# Patient Record
Sex: Female | Born: 1970 | Race: Black or African American | Hispanic: No | Marital: Single | Smoking: Former smoker
Health system: Southern US, Community
[De-identification: ages and names within clinical notes are randomized; demographics above are authoritative.]

## PROBLEM LIST (undated history)

## (undated) DIAGNOSIS — Z789 Other specified health status: Secondary | ICD-10-CM

## (undated) DIAGNOSIS — I10 Essential (primary) hypertension: Secondary | ICD-10-CM

## (undated) HISTORY — DX: Essential (primary) hypertension: I10

## (undated) HISTORY — PX: OTHER SURGICAL HISTORY: SHX169

---

## 2012-06-20 ENCOUNTER — Emergency Department (HOSPITAL_COMMUNITY)
Admission: EM | Admit: 2012-06-20 | Discharge: 2012-06-20 | Disposition: A | Discharge status: Home / Self Care | Payer: Self-pay | Attending: Emergency Medicine | Admitting: Emergency Medicine

## 2012-06-20 ENCOUNTER — Emergency Department (HOSPITAL_COMMUNITY): Payer: Self-pay

## 2012-06-20 ENCOUNTER — Encounter (HOSPITAL_COMMUNITY): Payer: Self-pay | Admitting: Cardiology

## 2012-06-20 DIAGNOSIS — M25469 Effusion, unspecified knee: Secondary | ICD-10-CM | POA: Insufficient documentation

## 2012-06-20 DIAGNOSIS — F17200 Nicotine dependence, unspecified, uncomplicated: Secondary | ICD-10-CM | POA: Insufficient documentation

## 2012-06-20 DIAGNOSIS — M25462 Effusion, left knee: Secondary | ICD-10-CM

## 2012-06-20 MED ORDER — NAPROXEN 250 MG PO TABS: 500.0000 mg | ORAL_TABLET | Freq: Once | ORAL | Status: DC

## 2012-06-20 MED ORDER — NAPROXEN 500 MG PO TABS: 500.0000 mg | ORAL_TABLET | Freq: Two times a day (BID) | ORAL | Status: DC

## 2012-06-20 MED ORDER — HYDROCODONE-ACETAMINOPHEN 5-325 MG PO TABS: 2.0000 | ORAL_TABLET | ORAL | Status: DC | PRN

## 2012-06-20 NOTE — ED Provider Notes (Signed)
History     CSN: [id number]  Arrival date & time 06/20/12  1054   First MD Initiated Contact with Patient 06/20/12 1245      Chief Complaint  Patient presents with  . Knee Pain    (Consider location/radiation/quality/duration/timing/severity/associated sxs/prior treatment) HPI  42 year old female with history of gout in the family presents complaining of left knee pain and swelling. Patient reports for the past couple of days she has increasing pain to left knee with some swelling. Describe pain as a sharp sensation worsening with movement and with weightbearing. Swelling has been ongoing for the past 3-4 days. Patient wear a brace last night and the swelling has improved.  Patient recall injuring the left knee many years ago and has had intermittent pain and swelling to the knee but never this significant. Otherwise she denies fever, chills, rash, numbness, weakness. No recent injury and no strenuous exercise.   History reviewed. No pertinent past medical history.  History reviewed. No pertinent past surgical history.  History reviewed. No pertinent family history.  History  Substance Use Topics  . Smoking status: Current Every Day Smoker  . Smokeless tobacco: Not on file  . Alcohol Use: Yes    OB History   Grav Para Term Preterm Abortions TAB SAB Ect Mult Living                  Review of Systems  Constitutional: Negative for fever.  Musculoskeletal: Positive for joint swelling and arthralgias. Negative for back pain.  Skin: Negative for rash.    Allergies  Review of patient's allergies indicates no known allergies.  Home Medications   Current Outpatient Rx  Name  Route  Sig  Dispense  Refill  . ibuprofen (ADVIL,MOTRIN) 200 MG tablet   Oral   Take 200 mg by mouth every 6 (six) hours as needed for pain, fever or headache.         . indomethacin (INDOCIN) 25 MG capsule   Oral   Take 25 mg by mouth once.           BP 168/90  Pulse 75  Temp(Src) 97.6  F (36.4 C) (Oral)  Resp 16  SpO2 99%  LMP 06/13/2012  Physical Exam  Nursing note and vitals reviewed. Constitutional: She appears well-developed and well-nourished. No distress.  Moderately obese  Eyes: Conjunctivae are normal.  Neck: Normal range of motion. Neck supple.  Musculoskeletal: She exhibits tenderness (L knee: moderate effusion noted with tenderness to suprapatella region.  decreased knee flexion.  NO joint laxity, no rash, no ecchymosis.  normal L hip and ankle on exam).  Neurological: She is alert.  Skin: Skin is warm. No rash noted.  Psychiatric: She has a normal mood and affect.    ED Course  Procedures (including critical care time)  1:28 PM Pt with effusion and pain to L knee.  No warmth, no redness, no rash no fever concerning for septic arthritis.  Pt is afebrile.  Able to ambulate.  Xray shows knee effusion without acute bony abnormality.  I recommend RICE therapy and referral to Ortho.  I also offer for knee aspiration but pt opted for conservative treatment first.  Return precaution discussed.    Labs Reviewed - No data to display Dg Knee Complete 4 Views Left  06/20/2012  *RADIOLOGY REPORT*  Clinical Data: Left knee pain.  LEFT KNEE - COMPLETE 4+ VIEW  Comparison: None  Findings: There is no evidence of acute fracture, subluxation or dislocation. A large  knee effusion is present. No significant degenerative changes are present. The joint space is otherwise unremarkable. No focal bony lesions are noted.  IMPRESSION: Knee effusion without acute bony abnormality.   Original Report Authenticated By: Harmon Pier, M.D.      1. Knee effusion, left       MDM  BP 168/90  Pulse 75  Temp(Src) 97.6 F (36.4 C) (Oral)  Resp 16  SpO2 99%  LMP 06/13/2012  I have reviewed nursing notes and vital signs. I personally reviewed the imaging tests through PACS system  I reviewed available ER/hospitalization records thought the EMR         Fayrene Helper,  [place] 06/20/12 1332

## 2012-06-20 NOTE — ED Provider Notes (Signed)
Medical screening examination/treatment/procedure(s) were performed by non-physician practitioner and as supervising physician I was immediately available for consultation/collaboration. Devoria Albe, MD, Armando Gang    Ward Givens, MD 06/20/12 [phone]

## 2012-06-20 NOTE — ED Notes (Signed)
Pt reports left knee pain and swelling for the past couple of days. Reports increased pain with weight bearing. Reports hx of gout in family.

## 2016-10-26 ENCOUNTER — Emergency Department (HOSPITAL_COMMUNITY): Payer: Self-pay

## 2016-10-26 DIAGNOSIS — I639 Cerebral infarction, unspecified: Principal | ICD-10-CM | POA: Diagnosis present

## 2016-10-26 DIAGNOSIS — R269 Unspecified abnormalities of gait and mobility: Secondary | ICD-10-CM | POA: Diagnosis present

## 2016-10-26 DIAGNOSIS — E785 Hyperlipidemia, unspecified: Secondary | ICD-10-CM | POA: Diagnosis present

## 2016-10-26 DIAGNOSIS — R29702 NIHSS score 2: Secondary | ICD-10-CM | POA: Diagnosis present

## 2016-10-26 DIAGNOSIS — F17210 Nicotine dependence, cigarettes, uncomplicated: Secondary | ICD-10-CM | POA: Diagnosis present

## 2016-10-26 DIAGNOSIS — Z6837 Body mass index (BMI) 37.0-37.9, adult: Secondary | ICD-10-CM

## 2016-10-26 DIAGNOSIS — Z8249 Family history of ischemic heart disease and other diseases of the circulatory system: Secondary | ICD-10-CM

## 2016-10-26 DIAGNOSIS — I10 Essential (primary) hypertension: Secondary | ICD-10-CM | POA: Diagnosis present

## 2016-10-26 DIAGNOSIS — G8191 Hemiplegia, unspecified affecting right dominant side: Secondary | ICD-10-CM | POA: Diagnosis present

## 2016-10-26 DIAGNOSIS — E669 Obesity, unspecified: Secondary | ICD-10-CM | POA: Diagnosis present

## 2016-10-26 LAB — COMPREHENSIVE METABOLIC PANEL
ALK PHOS: 63 U/L (ref 38–126)
ALT: 19 U/L (ref 14–54)
ANION GAP: 9 (ref 5–15)
AST: 19 U/L (ref 15–41)
Albumin: 4.2 g/dL (ref 3.5–5.0)
BILIRUBIN TOTAL: 0.4 mg/dL (ref 0.3–1.2)
BUN: 8 mg/dL (ref 6–20)
CALCIUM: 9.1 mg/dL (ref 8.9–10.3)
CO2: 22 mmol/L (ref 22–32)
Chloride: 103 mmol/L (ref 101–111)
Creatinine, Ser: 0.97 mg/dL (ref 0.44–1.00)
GFR calc Af Amer: >60 mL/min (ref >60)
Glucose, Bld: 89 mg/dL (ref 65–99)
POTASSIUM: 3.5 mmol/L (ref 3.5–5.1)
Sodium: 134 mmol/L — ABNORMAL LOW (ref 135–145)
TOTAL PROTEIN: 7.3 g/dL (ref 6.5–8.1)

## 2016-10-26 LAB — I-STAT CHEM 8, ED
BUN: 10 mg/dL (ref 6–20)
CALCIUM ION: 1.08 mmol/L — AB (ref 1.15–1.40)
CREATININE: 0.9 mg/dL (ref 0.44–1.00)
Chloride: 100 mmol/L — ABNORMAL LOW (ref 101–111)
Glucose, Bld: 90 mg/dL (ref 65–99)
HEMATOCRIT: 46 % (ref 36.0–46.0)
HEMOGLOBIN: 15.6 g/dL — AB (ref 12.0–15.0)
Potassium: 4.2 mmol/L (ref 3.5–5.1)
SODIUM: 138 mmol/L (ref 135–145)
TCO2: 31 mmol/L (ref 22–32)

## 2016-10-26 LAB — I-STAT TROPONIN, ED: TROPONIN I, POC: 0 ng/mL (ref 0.00–0.08)

## 2016-10-26 LAB — DIFFERENTIAL
BASOS ABS: 0 10*3/uL (ref 0.0–0.1)
Basophils Relative: 0 %
EOS ABS: 0.1 10*3/uL (ref 0.0–0.7)
EOS PCT: 2 %
LYMPHS ABS: 2.7 10*3/uL (ref 0.7–4.0)
Lymphocytes Relative: 37 %
MONOS PCT: 10 %
Monocytes Absolute: 0.7 10*3/uL (ref 0.1–1.0)
NEUTROS ABS: 3.7 10*3/uL (ref 1.7–7.7)
Neutrophils Relative %: 51 %

## 2016-10-26 LAB — CBC
HCT: 41.5 % (ref 36.0–46.0)
Hemoglobin: 13.6 g/dL (ref 12.0–15.0)
MCH: 25.2 pg — ABNORMAL LOW (ref 26.0–34.0)
MCHC: 32.8 g/dL (ref 30.0–36.0)
MCV: 76.9 fL — ABNORMAL LOW (ref 78.0–100.0)
Platelets: 324 10*3/uL (ref 150–400)
RBC: 5.4 MIL/uL — ABNORMAL HIGH (ref 3.87–5.11)
RDW: 14.9 % (ref 11.5–15.5)
WBC: 7.2 10*3/uL (ref 4.0–10.5)

## 2016-10-26 LAB — CBG MONITORING, ED: GLUCOSE-CAPILLARY: 105 mg/dL — AB (ref 65–99)

## 2016-10-26 LAB — APTT: APTT: 30 s (ref 24–36)

## 2016-10-26 LAB — PROTIME-INR
INR: 0.95
PROTHROMBIN TIME: 12.6 s (ref 11.4–15.2)

## 2016-10-26 NOTE — ED Triage Notes (Addendum)
At 9:40pm pt was laying in bed and began to feel her righ arm go numb, when she tried to get up her right leg "felt like it was falling...barely able to walk on it."  Pt scores a 0 on the NIH scale.  Now she has some tingling but no deficits at this time.  Dr. Rubin PayorPickering instructed RN to do a stroke workup but not call a code stroke

## 2016-10-27 ENCOUNTER — Observation Stay (HOSPITAL_COMMUNITY): Payer: Self-pay

## 2016-10-27 ENCOUNTER — Observation Stay (HOSPITAL_BASED_OUTPATIENT_CLINIC_OR_DEPARTMENT_OTHER): Payer: Self-pay

## 2016-10-27 ENCOUNTER — Emergency Department (HOSPITAL_COMMUNITY): Payer: Self-pay

## 2016-10-27 ENCOUNTER — Encounter (HOSPITAL_COMMUNITY): Payer: Self-pay

## 2016-10-27 ENCOUNTER — Inpatient Hospital Stay (HOSPITAL_COMMUNITY)
Admission: EM | Admit: 2016-10-27 | Discharge: 2016-10-29 | DRG: 065 | Disposition: A | Discharge status: Home / Self Care | Payer: Self-pay | Attending: Internal Medicine | Admitting: Internal Medicine

## 2016-10-27 DIAGNOSIS — I639 Cerebral infarction, unspecified: Secondary | ICD-10-CM

## 2016-10-27 DIAGNOSIS — I69398 Other sequelae of cerebral infarction: Secondary | ICD-10-CM

## 2016-10-27 DIAGNOSIS — R269 Unspecified abnormalities of gait and mobility: Secondary | ICD-10-CM

## 2016-10-27 DIAGNOSIS — I6381 Other cerebral infarction due to occlusion or stenosis of small artery: Secondary | ICD-10-CM | POA: Diagnosis present

## 2016-10-27 DIAGNOSIS — I10 Essential (primary) hypertension: Secondary | ICD-10-CM | POA: Diagnosis present

## 2016-10-27 DIAGNOSIS — F17200 Nicotine dependence, unspecified, uncomplicated: Secondary | ICD-10-CM | POA: Diagnosis present

## 2016-10-27 DIAGNOSIS — I5030 Unspecified diastolic (congestive) heart failure: Secondary | ICD-10-CM

## 2016-10-27 DIAGNOSIS — R531 Weakness: Secondary | ICD-10-CM

## 2016-10-27 DIAGNOSIS — E669 Obesity, unspecified: Secondary | ICD-10-CM | POA: Diagnosis present

## 2016-10-27 HISTORY — DX: Other specified health status: Z78.9

## 2016-10-27 LAB — VAS US CAROTID
LCCADSYS: -98 cm/s
LEFT ECA DIAS: -16 cm/s
LEFT VERTEBRAL DIAS: -16 cm/s
LICAPDIAS: 14 cm/s
LICAPSYS: 64 cm/s
Left CCA dist dias: -27 cm/s
Left CCA prox dias: 21 cm/s
Left CCA prox sys: 135 cm/s
Left ICA dist dias: -40 cm/s
Left ICA dist sys: -96 cm/s
RCCAPDIAS: 24 cm/s
RIGHT ECA DIAS: -23 cm/s
RIGHT VERTEBRAL DIAS: -13 cm/s
Right CCA prox sys: 129 cm/s
Right cca dist sys: -101 cm/s

## 2016-10-27 LAB — RAPID URINE DRUG SCREEN, HOSP PERFORMED
Amphetamines: NOT DETECTED
BARBITURATES: NOT DETECTED
Benzodiazepines: NOT DETECTED
COCAINE: NOT DETECTED
OPIATES: NOT DETECTED
Tetrahydrocannabinol: NOT DETECTED

## 2016-10-27 LAB — HIV ANTIBODY (ROUTINE TESTING W REFLEX): HIV Screen 4th Generation wRfx: NONREACTIVE

## 2016-10-27 LAB — ECHOCARDIOGRAM COMPLETE

## 2016-10-27 LAB — TSH: TSH: 1.652 u[IU]/mL (ref 0.350–4.500)

## 2016-10-27 MED ORDER — HYDRALAZINE HCL 20 MG/ML IJ SOLN: 10.0000 mg | Freq: Four times a day (QID) | INTRAMUSCULAR | Status: DC | PRN

## 2016-10-27 MED ORDER — LORAZEPAM 2 MG/ML IJ SOLN
INTRAMUSCULAR | Status: AC
  Filled 2016-10-27: qty 1

## 2016-10-27 MED ORDER — STROKE: EARLY STAGES OF RECOVERY BOOK
Freq: Once | Status: AC
  Administered 2016-10-27: 11:00:00
  Filled 2016-10-27: qty 1

## 2016-10-27 MED ORDER — ADULT MULTIVITAMIN W/MINERALS CH
1.0000 | ORAL_TABLET | Freq: Every day | ORAL | Status: DC
  Administered 2016-10-27 – 2016-10-29 (×3): 1 via ORAL
  Filled 2016-10-27 (×3): qty 1

## 2016-10-27 MED ORDER — B COMPLEX-C PO TABS
1.0000 | ORAL_TABLET | Freq: Every day | ORAL | Status: DC
  Administered 2016-10-27 – 2016-10-29 (×3): 1 via ORAL
  Filled 2016-10-27 (×3): qty 1

## 2016-10-27 MED ORDER — ALPRAZOLAM 0.25 MG PO TABS: 0.2500 mg | ORAL_TABLET | Freq: Three times a day (TID) | ORAL | Status: DC | PRN

## 2016-10-27 MED ORDER — LORAZEPAM 2 MG/ML IJ SOLN: 1.0000 mg | Freq: Once | INTRAMUSCULAR | Status: DC

## 2016-10-27 MED ORDER — IOPAMIDOL (ISOVUE-370) INJECTION 76%
INTRAVENOUS | Status: AC
  Administered 2016-10-27: 50 mL via INTRAVENOUS
  Filled 2016-10-27: qty 50

## 2016-10-27 MED ORDER — ZOLPIDEM TARTRATE 5 MG PO TABS: 5.0000 mg | ORAL_TABLET | Freq: Every evening | ORAL | Status: DC | PRN

## 2016-10-27 MED ORDER — VITAMIN D 1000 UNITS PO TABS
1000.0000 [IU] | ORAL_TABLET | Freq: Every day | ORAL | Status: DC
  Administered 2016-10-27 – 2016-10-29 (×3): 1000 [IU] via ORAL
  Filled 2016-10-27 (×3): qty 1

## 2016-10-27 MED ORDER — ACETAMINOPHEN 650 MG RE SUPP
650.0000 mg | RECTAL | Status: DC | PRN
Start: 2016-10-27 — End: 2016-10-29

## 2016-10-27 MED ORDER — ACETAMINOPHEN 325 MG PO TABS: 650.0000 mg | ORAL_TABLET | ORAL | Status: DC | PRN

## 2016-10-27 MED ORDER — ASPIRIN 325 MG PO TABS
325.0000 mg | ORAL_TABLET | Freq: Every day | ORAL | Status: DC
  Administered 2016-10-27 – 2016-10-29 (×3): 325 mg via ORAL
  Filled 2016-10-27 (×3): qty 1

## 2016-10-27 MED ORDER — GLUCOSAMINE-CHONDROITIN 500-400 MG PO TABS: 1.0000 | ORAL_TABLET | Freq: Every day | ORAL | Status: DC

## 2016-10-27 MED ORDER — ENOXAPARIN SODIUM 40 MG/0.4ML ~~LOC~~ SOLN
40.0000 mg | SUBCUTANEOUS | Status: DC
  Administered 2016-10-27 – 2016-10-29 (×3): 40 mg via SUBCUTANEOUS
  Filled 2016-10-27 (×3): qty 0.4

## 2016-10-27 MED ORDER — ACETAMINOPHEN 160 MG/5ML PO SOLN: 650.0000 mg | ORAL | Status: DC | PRN

## 2016-10-27 MED ORDER — ASPIRIN 300 MG RE SUPP: 300.0000 mg | Freq: Every day | RECTAL | Status: DC

## 2016-10-27 MED ORDER — ATORVASTATIN CALCIUM 40 MG PO TABS
40.0000 mg | ORAL_TABLET | Freq: Every day | ORAL | Status: DC
  Administered 2016-10-27 – 2016-10-28 (×2): 40 mg via ORAL
  Filled 2016-10-27 (×2): qty 1

## 2016-10-27 NOTE — Progress Notes (Signed)
STROKE TEAM PROGRESS NOTE   HISTORY OF PRESENT ILLNESS (per record) Pricilla Larssonanya Bigos is a right-handed 46 y.o African-American  female with medical history significant for tobacco use (current smoker) who [facility]sided weakness.  LKW at 8:30 PM on 10/26/2016 while she was to texting on her phone, when she noticed that her right hand felt heavy. She tried to get up from bed and noticed she was dragging her right leg. She also felt slightly confused. She called EMS who brought her to the hospital.  She apparently had hypertension in her 2920s and was briefly on antihypertensives. This is thought to be due to anxiety, and her blood pressure has been normal since. She currently does not see a physician and is not on any medications. Per mother EMS documented her BP to be 175/106.  Date last known well: 9.10.18 Time last known well: 20.30 NIHSS 2 Modified Rankin:0  Patient was not administered IV t-PA secondary to minimal deficits on arrival. She was admitted to General Neurology for further evaluation and treatment.   SUBJECTIVE (INTERVAL HISTORY) Her family is at the bedside.  The patient is awake, alert, and follows all commands appropriately.  Very mild RUE pronator drift, and slightly slower RUE fine motor skills.  RLE with 3+ strength. BP on the high side, not taking meds at home. Willing to quit smoking.    OBJECTIVE Temp:  [98 F (36.7 C)-99.2 F (37.3 C)] 98.4 F (36.9 C) (09/11 1630) Pulse Rate:  [77-103] 78 (09/11 1630) Cardiac Rhythm: Normal sinus rhythm (09/11 0700) Resp:  [16-33] 18 (09/11 1630) BP: (139-167)/(83-108) 143/89 (09/11 1630) SpO2:  [97 %-100 %] 97 % (09/11 1630) Weight:  [104.3 kg (230 lb)] 104.3 kg (230 lb) (09/10 2215)  CBC:   Recent Labs Lab 10/26/16 2225 10/26/16 2309  WBC 7.2  --   NEUTROABS 3.7  --   HGB 13.6 15.6*  HCT 41.5 46.0  MCV 76.9*  --   PLT 324  --     Basic Metabolic Panel:   Recent Labs Lab 10/26/16 2225  10/26/16 2309  NA 134* 138  K 3.5 4.2  CL 103 100*  CO2 22  --   GLUCOSE 89 90  BUN 8 10  CREATININE 0.97 0.90  CALCIUM 9.1  --     Lipid Panel: No results found for: CHOL, TRIG, HDL, CHOLHDL, VLDL, LDLCALC HgbA1c: No results found for: HGBA1C Urine Drug Screen:     Component Value Date/Time   LABOPIA NONE DETECTED 10/27/2016 1305   COCAINSCRNUR NONE DETECTED 10/27/2016 1305   LABBENZ NONE DETECTED 10/27/2016 1305   AMPHETMU NONE DETECTED 10/27/2016 1305   THCU NONE DETECTED 10/27/2016 1305   LABBARB NONE DETECTED 10/27/2016 1305    Alcohol Level No results found for: ETH  IMAGING I have personally reviewed the radiological images below and agree with the radiology interpretations.  Ct Angio Head W Or Wo Contrast Ct Angio Neck W Or Wo Contrast  10/27/2016 IMPRESSION: 1. No emergent large vessel occlusion. 2. No posterior circulation atherosclerosis or stenosis identified. Somewhat diminutive vertebrobasilar system appears to be related to bilateral posterior communicating arteries contributing to the PCAs. 3. Mild right carotid bifurcation and minimal ICA siphon plaque without stenosis. 4. CT appearance of the brain remains normal. 5. No acute findings in the neck.   Ct Head Wo Contrast 10/26/2016 IMPRESSION: No acute intracranial abnormality.  Mr Brain Wo Contrast  10/27/2016 IMPRESSION: 1. Acute LEFT thalamus lacunar infarct. 2. Mild white matter  changes compatible with chronic small vessel ischemic disease, atypical distribution for demyelination.  TTE 10/27/2016 Study Conclusions - Left ventricle: The cavity size was normal. Wall thickness was   increased in a pattern of mild LVH. Systolic function was vigorous. The estimated ejection fraction was in the range of 65% to 70%. Wall motion was normal; there were no regional wall motion abnormalities. Doppler parameters are consistent with abnormal left ventricular relaxation (grade 1 diastolic dysfunction). - Mitral valve:  Calcified annulus. Impressions: - Vigorous LV systolic function; mild LVH; mild diastolic dysfunction.  Carotid US 10/27/2016 Summary: - The vertebral arteries appear patent with antegrade flow. - Findings consistent with a 1- 39 percent stenosis involving the right internal carotid artery and the left internal carotid artery.   PHYSICAL EXAM  Temp:  [98 F (36.7 C)-98.5 F (36.9 C)] 98.3 F (36.8 C) (09/11 1807) Pulse Rate:  [75-92] 75 (09/11 1807) Resp:  [16-33] 20 (09/11 1807) BP: (131-167)/(83-99) 131/94 (09/11 1807) SpO2:  [97 %-100 %] 98 % (09/11 1807)  General - Well nourished, well developed, in no apparent distress.  Ophthalmologic - Sharp disc margins OU.   Cardiovascular - Regular rate and rhythm.  Mental Status -  Level of arousal and orientation to time, place, and person were intact. Language including expression, naming, repetition, comprehension was assessed and found intact. Fund of Knowledge was assessed and was intact.  Cranial Nerves II - XII - II - Visual field intact OU. III, IV, VI - Extraocular movements intact. V - Facial sensation slightly decreased on the right. VII - right nasolabial fold flattening VIII - Hearing & vestibular intact bilaterally. X - Palate elevates symmetrically. XI - Chin turning & shoulder shrug intact bilaterally. XII - Tongue protrusion intact.  Motor Strength - The patient's strength was 4+/5 RUE and pronator drift was absent, RLE 3+/5, LUE and LLE 5/5.  Bulk was normal and fasciculations were absent.   Motor Tone - Muscle tone was assessed at the neck and appendages and was normal.  Reflexes - The patient's reflexes were 1+ in all extremities and she had no pathological reflexes.  Sensory - Light touch, temperature/pinprick, vibration and proprioception were assessed and were symmetrical.    Coordination - The patient had normal movements in the hands with no ataxia or dysmetria.  Tremor was absent.  Gait and  Station - deferred   ASSESSMENT/PLAN Ms. Adair Lemar is a 46 y.o. female with history of tobacco use (current smoker) who presented to [facility] with right-sided weakness. She did not receive IV t-PA due to minimal deficits on arrival.   Stroke: Acute LEFT thalamus lacunar infarct, secondary to small vessel disease   Resultant  Right mild hemparesis  CT head: no acute stroke  MRI head: Acute LEFT thalamus lacunar infarct.  Chronic small vessel disease.  CTA head and neck - unremarkable  Carotid Doppler: B ICA 1-39% stenosis, VAs antegrade  2D Echo: Vigorous LV systolic function; mild LVH; mild diastolic dysfunction  UDS negative   LDL pending  HgbA1c pending  Lovenox 40 mg sq daily for VTE prophylaxis Diet Heart Room service appropriate? Yes; Fluid consistency: Thin  No antithrombotic prior to admission, now on aspirin 325 mg daily. Continue ASA on discharge.  Patient counseled to be compliant with her antithrombotic medications  Ongoing aggressive stroke risk factor management  Therapy recommendations: CIR  Disposition: pending  Hypertension  Stable  Permissive hypertension (OK if < 220/120) but gradually normalize in 5-7 days  Long-term BP goal normotensive  Hyperlipidemia  Home meds: none  LDL pending, goal < 70  Add atorvastatin 40 mg PO  Continue statin at discharge  Tobacco abuse  Current smoker  Smoking cessation counseling provided  Pt is willing to quit  Other Stroke Risk Factors  ETOH use, advised to drink no more than 1 drink(s) a day  Borderline morbid obesity, Body mass index is 39.48 kg/m., recommend weight loss, diet and exercise as appropriate   Other Active Problems  Pt interested in [facility] dental trial  Hospital day # 0  Neurology will sign off. Please call with questions. Pt will follow up with Darrol Angel, NP, at [facility] in about 6 weeks. Thanks for the consult.  Marvel Plan, MD PhD Stroke Neurology 10/27/2016 11:20  PM   To contact Stroke Continuity provider, please refer to [email or web address]. After hours, contact General Neurology

## 2016-10-27 NOTE — ED Provider Notes (Signed)
TIME SEEN: 3:31 AM  CHIEF COMPLAINT: Right-sided numbness and weakness  HPI: Patient is a 46 year old female with obesity and possible hypertension who presents to the emergency department with complaints of right-sided weakness that started around 9 PM. States that she was talking on the phone when she felt like her right arm got very heavy and she had some tingling in the right hand. She tried to stand up from a seated position and felt like her right leg was weak. She feels like the numbness and tingling have resolved but the weakness has not improved. She has no history of stroke or TIA. No head injury, headache, neck pain or back pain. Has never had a complicated migraine before. Denies history of diabetes or hyperlipidemia. She is not a smoker.  ROS: See HPI Constitutional: no fever  Eyes: no drainage  ENT: no runny nose   Cardiovascular:  no chest pain  Resp: no SOB  GI: no vomiting GU: no dysuria Integumentary: no rash  Allergy: no hives  Musculoskeletal: no leg swelling  Neurological: no slurred speech ROS otherwise negative  PAST MEDICAL HISTORY/PAST SURGICAL HISTORY:  No past medical history on file.  MEDICATIONS:  Prior to Admission medications   Medication Sig Start Date End Date Taking? Authorizing Provider  Ascorbic Acid (VITA-C PO) Take 1 tablet by mouth daily.   Yes [provider]  b complex vitamins capsule Take 1 capsule by mouth daily.   Yes [provider]  Cholecalciferol (VITAMIN D3 PO) Take 1 capsule by mouth daily.   Yes [provider]  glucosamine-chondroitin 500-400 MG tablet Take 1 tablet by mouth daily.   Yes [provider]  KRILL OIL PO Take 1 capsule by mouth daily.   Yes [provider]  Multiple Vitamin (MULTIVITAMIN WITH MINERALS) TABS tablet Take 1 tablet by mouth daily.   Yes [provider]  OVER THE COUNTER MEDICATION Take 3 capsules by mouth daily. QuadraLean Diet Pills   Yes [provider]  POTASSIUM PO Take 1 capsule by mouth daily.   Yes [provider]  Probiotic Product (PROBIOTIC PO) Take 1 capsule by mouth daily.   Yes [provider]    ALLERGIES:  No Known Allergies  SOCIAL HISTORY:  Social History  Substance Use Topics  . Smoking status: Current Every Day Smoker  . Smokeless tobacco: Not on file  . Alcohol use Yes    FAMILY HISTORY: No family history on file.  EXAM: BP (!) 160/95   Pulse 84   Temp 98.2 F (36.8 C)   Resp 18   Ht  (1.626 m)   Wt 104.3 kg (230 lb)   SpO2 100%   BMI 39.48 kg/m  CONSTITUTIONAL: Alert and oriented and responds appropriately to questions. Well-appearing; well-nourished HEAD: Normocephalic EYES: Conjunctivae clear, pupils appear equal, EOMI ENT: normal nose; moist mucous membranes NECK: Supple, no meningismus, no nuchal rigidity, no LAD  CARD: RRR; S1 and S2 appreciated; no murmurs, no clicks, no rubs, no gallops RESP: Normal chest excursion without splinting or tachypnea; breath sounds clear and equal bilaterally; no wheezes, no rhonchi, no rales, no hypoxia or respiratory distress, speaking full sentences ABD/GI: Normal bowel sounds; non-distended; soft, non-tender, no rebound, no guarding, no peritoneal signs, no hepatosplenomegaly BACK:  The back appears normal and is non-tender to palpation, there is no CVA tenderness EXT: Normal ROM in all joints; non-tender to palpation; no edema; normal capillary refill; no cyanosis, no calf tenderness or swelling  SKIN: Normal color for age and race; warm; no rash NEURO: Moves all extremities equally; Patient does have some pronator drift in the right upper and lower extremity is very mild, Strength 5/5 in all four extremities.  Normal sensation diffusely.  CN 2-12 grossly intact.  No dysmetria to finger to nose testing bilaterally.  Normal speech.  Normal gait. PSYCH: The patient's mood and manner are appropriate. Grooming and personal  hygiene are appropriate.  MEDICAL DECISION MAKING: Patient here with right-sided weakness. I would give her a NIH stroke scale of 2.  She feels like her numbness has improved. CT imaging, labs obtained in triage are unremarkable. We'll discuss with neurology. Symptoms started around 9 PM.   ED PROGRESS:   3:30 AM  D/w Dr. Laurence SlateAroor With neurology who will see the patient in consult. He recommends medical admission for stroke workup.  States patient is not a TPA candidate given symptoms improving.   3:55 AM Discussed patient's case with hospitalist, Dr. Katrinka BlazingSmith.  I have recommended admission and patient (and family if present) agree with this plan. Admitting physician will place admission orders.   I reviewed all nursing notes, vitals, pertinent previous records, EKGs, lab and urine results, imaging (as available).     EKG Interpretation  Date/Time:  Monday October 26 2016 22:17:22 EDT Ventricular Rate:  97 PR Interval:  164 QRS Duration: 82 QT Interval:  350 QTC Calculation: 444 R Axis:   41 Text Interpretation:  Normal sinus rhythm Normal ECG No old tracing to compare Confirmed by Hazely Sealey, Baxter HireKristen [id number]) on 10/27/2016 3:20:56 AM         Kariana Wiles, Layla MawKristen N, DO 10/27/16 [id number]

## 2016-10-27 NOTE — Consult Note (Signed)
Requesting Physician: Dr. Elesa Massed    Chief Complaint: right-sided weakness  History obtained from:  Patient   Chart  Patient and Chart     HPI:                                                                                                                                       Alexandra Church is an 46 y.o. female African-American right-handed with medical history significant for tobacco use  presents of emergency room with right-sided weakness`.  She was a normal self until 8:30 PM yesterday while she was to texting on her phone, she noticed that her right hand felt heavy. She tried to get up from bed and noticed she was dragging her right leg. She also felt slightly confused. She called EMS who brought her to the hospital.  She apparently had hypertension in her 37s and was briefly on antihypertensives. This is thought to be due to anxiety, and her blood pressure has been normal since. She currently does not see a physician and is not on any medications. Per mother EMS documented her BP to be 175/106.  Date last known well: 9.10.18 Time last known well: 20.30 tPA Given: outside window NIHSS 2 Modified Rankin:0  No past medical history on file.  No past surgical history on file.  No family history on file. Social History:  reports that she has been smoking.  She does not have any smokeless tobacco history on file. She reports that she drinks alcohol. She reports that she does not use drugs.  Allergies: No Known Allergies  Medications:                                                                                                                           None   ROS:  General ROS: negative for - chills, fatigue, fever, night sweats, weight gain or weight loss Psychological ROS: negative for - behavioral disorder, hallucinations, memory difficulties, mood  swings or suicidal ideation Ophthalmic ROS: negative for - blurry vision, double vision, eye pain or loss of vision ENT ROS: negative for - epistaxis, nasal discharge, oral lesions, sore throat, tinnitus or vertigo Allergy and Immunology ROS: negative for - hives or itchy/watery eyes Hematological and Lymphatic ROS: negative for - bleeding problems, bruising or swollen lymph nodes Endocrine ROS: negative for - galactorrhea, hair pattern changes, polydipsia/polyuria or temperature intolerance Respiratory ROS: negative for - cough, hemoptysis, shortness of breath or wheezing Cardiovascular ROS: negative for - chest pain, dyspnea on exertion, edema or irregular heartbeat Gastrointestinal ROS: negative for - abdominal pain, diarrhea, hematemesis, nausea/vomiting or stool incontinence Genito-Urinary ROS: negative for - dysuria, hematuria, incontinence or urinary frequency/urgency Musculoskeletal ROS: negative for - joint swelling or muscular weakness Neurological ROS: as noted in HPI Dermatological ROS: negative for rash and skin lesion changes   Examination:                                                                                                      General: Appears well-developed and well-nourished.  Psych: Affect appropriate to situation Eyes: No scleral injection HENT: No OP obstrucion Head: Normocephalic.  Cardiovascular: Normal rate and regular rhythm.  Respiratory: Effort normal and breath sounds normal to anterior ascultation GI: Soft.  No distension. There is no tenderness.  Skin: WDI   Neurological Examination Mental Status: Alert, oriented, thought content appropriate.  Speech fluent without evidence of aphasia.  Able to follow 3 step commands without difficulty. Cranial Nerves: II: Discs flat bilaterally; Visual fields grossly normal,  III,IV, VI: ptosis not present, extra-ocular motions intact bilaterally, pupils equal, round, reactive to light and  accommodation V,VII: smile symmetric, facial light touch sensation normal bilaterally VIII: hearing normal bilaterally IX,X: uvula rises symmetrically XI: bilateral shoulder shrug XII: midline tongue extension Motor: Right : Upper extremity   4+/5 , reduced speed on finger tapping   Left:     Upper extremity   5/5  Lower extremity   4/5                                                      Lower extremity   5/5 Tone and bulk:normal tone throughout; no atrophy noted Sensory: Pinprick and light touch intact throughout, bilaterally Deep Tendon Reflexes: 2+ and symmetric throughout Plantars: Right: downgoing   Left: downgoing Cerebellar: normal finger-to-nose, normal rapid alternating movements and normal heel-to-shin test Gait: normal gait and station     Lab Results: Basic Metabolic Panel:  Recent Labs Lab 10/26/16 2225 10/26/16 2309  NA 134* 138  K 3.5 4.2  CL 103 100*  CO2 22  --   GLUCOSE 89 90  BUN 8 10  CREATININE 0.97 0.90  CALCIUM 9.1  --  CBC:  Recent Labs Lab 10/26/16 2225 10/26/16 2309  WBC 7.2  --   NEUTROABS 3.7  --   HGB 13.6 15.6*  HCT 41.5 46.0  MCV 76.9*  --   PLT 324  --     Coagulation Studies:  Recent Labs  10/26/16 2225  LABPROT 12.6  INR 0.95    Imaging: Ct Head Wo Contrast  Result Date: 10/26/2016 CLINICAL DATA:  Right arm and leg weakness since this evening. Paresthesias. EXAM: CT HEAD WITHOUT CONTRAST TECHNIQUE: Contiguous axial images were obtained from the base of the skull through the vertex without intravenous contrast. COMPARISON:  None. FINDINGS: Brain: No evidence of acute infarction, hemorrhage, hydrocephalus, extra-axial collection or mass lesion/mass effect. Vascular: No hyperdense vessel or unexpected calcification. Slight atherosclerosis of the carotid siphons bilaterally. Skull: Normal. Negative for fracture or focal lesion. Sinuses/Orbits: No acute finding. Other: None. IMPRESSION: No acute intracranial abnormality.  Electronically Signed   By: Tollie Eth M.D.   On: 10/26/2016 23:22   Mr Brain Wo Contrast  Result Date: 10/27/2016 CLINICAL DATA:  RIGHT-sided numbness and weakness. History of hypertension and obesity. EXAM: MRI HEAD WITHOUT CONTRAST TECHNIQUE: Multiplanar, multiecho pulse sequences of the brain and surrounding structures were obtained without intravenous contrast. COMPARISON:  CT HEAD October 26, 2016 FINDINGS: BRAIN: Subcentimeter reduced diffusion LEFT thalamus with low ADC values and faint FLAIR T2 hyperintense signal. No susceptibility artifact to suggest hemorrhage. The ventricles and sulci are normal for patient's age. Scattered subcentimeter supratentorial white matter FLAIR T2 hyperintensities. No suspicious parenchymal signal, mass or mass effect. No abnormal extra-axial fluid collections. VASCULAR: Normal major intracranial vascular flow voids present at skull base. SKULL AND UPPER CERVICAL SPINE: No abnormal sellar expansion. No suspicious calvarial bone marrow signal. Craniocervical junction maintained. SINUSES/ORBITS: The mastoid air-cells and included paranasal sinuses are well-aerated. The included ocular globes and orbital contents are non-suspicious. OTHER: None. IMPRESSION: 1. Acute LEFT thalamus lacunar infarct. 2. Mild white matter changes compatible with chronic small vessel ischemic disease, atypical distribution for demyelination. Electronically Signed   By: Awilda Metro M.D.   On: 10/27/2016 05:34     ASSESSMENT AND PLAN    46 y.o. female African-American right-handed with medical history significant for tobacco use and  Hypertension( newly diagnosed) presents with sudden onset right-sided weakness MRI head shows patient has left lacunar thalamic infarct.   Left thalamic ischemic stroke   Risk factors: Hypertension Etiology: small vessel disease   Recommend #MRA Head and neck  #Transthoracic Echo  # Start patient on ASA  daily #Start or continue  Atorvastatin 80 mg/other high intensity statin # BP goal: permissive HTN upto 210 systolic, PRNs above 21 # HBAIC and Lipid profile # Telemetry monitoring # Frequent neuro checks # NPO until passes stroke swallow screen  Please page stroke NP  Or  PA  Or MD from 8am -4 pm  as this patient from this time will be  followed by the stroke.   You can look them up on [email or web address]  Password [facility]    Georgiana Spinner Tre Sanker MD [facility] [phone]  If 7pm to 7am, please call on call as listed on AMION.

## 2016-10-27 NOTE — Evaluation (Signed)
Speech Language Pathology Evaluation Patient Details Name: Pricilla Larssonanya Umanzor MRN: [id number] DOB: 05/20/1970 Today's Date: 10/27/2016 Time: [id number] SLP Time Calculation (min) (ACUTE ONLY): 13 min  Problem List:  Patient Active Problem List   Diagnosis Date Noted  . Right sided weakness 10/27/2016  . Acute thalamic infarction (HCC) 10/27/2016  . HTN (hypertension) 10/27/2016  . Obesity (BMI 30-39.9) 10/27/2016  . Tobacco use disorder 10/27/2016   Past Medical History:  Past Medical History:  Diagnosis Date  . Known health problems: none    Past Surgical History:  Past Surgical History:  Procedure Laterality Date  . no prior surgery     HPI:  46 y.o. female with medical history significant for tobacco useand hypertension (newly diagnosed) presents with sudden onset right-sided weakness.  MRI head shows patient has left lacunar thalamic infarct.    Assessment / Plan / Recommendation Clinical Impression  Pt presents with clear articulation, fluent speech, normal expression and auditory comprehension.  No SLP f/u is needed - our services will sign off.     SLP Assessment  SLP Recommendation/Assessment: Patient does not need any further Speech Lanaguage Pathology Services SLP Visit Diagnosis: Cognitive communication deficit (R41.841)    Follow Up Recommendations  None    Frequency and Duration           SLP Evaluation Cognition  Overall Cognitive Status: Within Functional Limits for tasks assessed Orientation Level: Oriented X4       Comprehension  Auditory Comprehension Overall Auditory Comprehension: Appears within functional limits for tasks assessed Yes/No Questions: Within Functional Limits Commands: Within Functional Limits Conversation: Complex Visual Recognition/Discrimination Discrimination: Within Function Limits Reading Comprehension Reading Status: Within funtional limits    Expression Expression Primary Mode of Expression: Verbal Verbal  Expression Overall Verbal Expression: Appears within functional limits for tasks assessed Naming: No impairment Written Expression Dominant Hand: Right Written Expression: Not tested   Oral / Motor  Oral Motor/Sensory Function Overall Oral Motor/Sensory Function: Within functional limits Motor Speech Overall Motor Speech: Appears within functional limits for tasks assessed   GO                    Blenda MountsCouture, Analyn Matusek Laurice 10/27/2016, 3:44 PM

## 2016-10-27 NOTE — Evaluation (Signed)
Physical Therapy Evaluation Patient Details Name: Alexandra Church MRN: [id number] DOB: 01/15/1971 Today's Date: 10/27/2016   History of Present Illness  Pt admitted with R side weakness, MRI + L thalamus lacunar infart. PMH: tobacco use, HTN  Clinical Impression  Pt admitted with above diagnosis. Pt currently with functional limitations due to the deficits listed below (see PT Problem List). Pt initially needing mod A with mobility due to R lean and ataxic motions RLE, improved with ambulation distance and receiving min HHA +2 by end of ambulation.  Pt will benefit from skilled PT to increase their independence and safety with mobility to allow discharge to the venue listed below.       Follow Up Recommendations CIR    Equipment Recommendations  Other (comment) (TBD)    Recommendations for Other Services       Precautions / Restrictions Precautions Precautions: Fall Restrictions Weight Bearing Restrictions: No      Mobility  Bed Mobility Overal bed mobility: Modified Independent                Transfers Overall transfer level: Needs assistance Equipment used: 1 person hand held assist Transfers: Sit to/from Stand Sit to Stand: Min assist;Mod assist         General transfer comment: upon initial standing, pt's right knee partially buckled and pt with posterior lean requiring return to sitting. Demonstrated poor core stability with initial standing but progressed as she remained up  Ambulation/Gait Ambulation/Gait assistance: Min assist;Mod assist Ambulation Distance (Feet): 125 Feet Assistive device: 2 person hand held assist Gait Pattern/deviations: Step-through pattern;Decreased weight shift to left;Staggering right;Ataxic Gait velocity: decreased Gait velocity interpretation: Below normal speed for age/gender General Gait Details: pt needing mod A first half of ambulation with significant R lean, improved with distance, needing min A last half of ambulation.    Stairs            Wheelchair Mobility    Modified Rankin (Stroke Patients Only) Modified Rankin (Stroke Patients Only) Pre-Morbid Rankin Score: No symptoms Modified Rankin: Moderately severe disability     Balance Overall balance assessment: Needs assistance Sitting-balance support: No upper extremity supported Sitting balance-Leahy Scale: Good     Standing balance support: Single extremity supported Standing balance-Leahy Scale: Poor Standing balance comment: needs at least one hand support to stabilize in static standing                             Pertinent Vitals/Pain Pain Assessment: No/denies pain    Home Living Family/patient expects to be discharged to:: Private residence Living Arrangements: Alone Available Help at Discharge: Family;Friend(s) Type of Home: House Home Access: Stairs to enter Entrance Stairs-Rails: Right Entrance Stairs-Number of Steps: 4 Home Layout: One level Home Equipment: None      Prior Function Level of Independence: Independent         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, teaches voice and piano lessons, and is a motivational speaker     Hand Dominance   Dominant Hand: Right    Extremity/Trunk Assessment   Upper Extremity Assessment Upper Extremity Assessment: Defer to OT evaluation RUE Deficits / Details: 4+5, incoordination, noted to posture in flexion with walking RUE Coordination: decreased fine motor    Lower Extremity Assessment Lower Extremity Assessment: RLE deficits/detail RLE Deficits / Details: hip flex 4/5, knee ext 4/5, knee flex 4-/5, ankle df 4/5 RLE Coordination: decreased fine motor;decreased gross motor  Cervical / Trunk Assessment Cervical / Trunk Assessment: Normal  Communication   Communication: No difficulties  Cognition Arousal/Alertness: Awake/alert Behavior During Therapy: WFL for tasks assessed/performed Overall Cognitive Status: Within Functional Limits for  tasks assessed                                 General Comments: pt tearful when ambulating      General Comments General comments (skin integrity, edema, etc.): pt reports that she has not slept while in hospital which is affecting current mood    Exercises     Assessment/Plan    PT Assessment Patient needs continued PT services  PT Problem List Decreased strength;Decreased balance;Decreased mobility;Decreased coordination;Decreased knowledge of use of DME;Decreased knowledge of precautions       PT Treatment Interventions DME instruction;Gait training;Stair training;Functional mobility training;Therapeutic activities;Therapeutic exercise;Balance training;Neuromuscular re-education;Cognitive remediation;Patient/family education    PT Goals (Current goals can be found in the Care Plan section)  Acute Rehab PT Goals Patient Stated Goal: to return to playing piano PT Goal Formulation: With patient Time For Goal Achievement: 11/10/16 Potential to Achieve Goals: Good    Frequency Min 4X/week   Barriers to discharge Decreased caregiver support lives alone    Co-evaluation PT/OT/SLP Co-Evaluation/Treatment: Yes Reason for Co-Treatment: Complexity of the patient's impairments (multi-system involvement) PT goals addressed during session: Mobility/safety with mobility;Balance;Strengthening/ROM         AM-PAC PT "6 Clicks" Daily Activity  Outcome Measure Difficulty turning over in bed (including adjusting bedclothes, sheets and blankets)?: None Difficulty moving from lying on back to sitting on the side of the bed? : None Difficulty sitting down on and standing up from a chair with arms (e.g., wheelchair, bedside commode, etc,.)?: Unable Help needed moving to and from a bed to chair (including a wheelchair)?: A Little Help needed walking in hospital room?: A Little Help needed climbing 3-5 steps with a railing? : A Lot 6 Click Score: 17    End of Session  Equipment Utilized During Treatment: Gait belt Activity Tolerance: Patient tolerated treatment well Patient left: in bed;with call bell/phone within reach;with family/visitor present Nurse Communication: Mobility status PT Visit Diagnosis: Unsteadiness on feet (R26.81);Hemiplegia and hemiparesis;Ataxic gait (R26.0) Hemiplegia - Right/Left: Right Hemiplegia - dominant/non-dominant: Dominant Hemiplegia - caused by: Cerebral infarction    Time: 1419-1445 PT Time Calculation (min) (ACUTE ONLY): 26 min   Charges:   PT Evaluation $PT Eval Moderate Complexity: 1 Mod     PT G Codes:   PT G-Codes **NOT FOR INPATIENT CLASS** Functional Assessment Tool Used: AM-PAC 6 Clicks Basic Mobility Functional Limitation: Mobility: Walking and moving around Mobility: Walking and Moving Around Current Status (W2956): At least 40 percent but less than 60 percent impaired, limited or restricted Mobility: Walking and Moving Around Goal Status [phone]): At least 1 percent but less than 20 percent impaired, limited or restricted    [place], PT  Acute Rehab Services  [phone]   Lawana Chambers Jasara Corrigan 10/27/2016, 4:10 PM

## 2016-10-27 NOTE — H&P (Signed)
History and Physical    Alexandra Larssonanya Weedman ZOX:[id number] DOB: 05/08/1970 DOA: 10/27/2016   PCP: Patient, No Pcp Per   Attending physician: Konrad DoloresMerrell  Patient coming from/Resides with: [facility]  Chief Complaint: Acute right-sided weakness  HPI: Alexandra Church is a 46 y.o. female with medical history significant for tobacco abuse who presented to the ER with abrupt onset of right side weakness and numbness beginning yesterday (9/10) at 9 PM. 24 hours prior she had noticed some nonspecified bilateral tingling of the fingers. CT of the head was unremarkable but an MRI of the brain did show an acute lacunar infarct in the thalamic region. Neurology has been consulted. Right side deficits persist although are very mild. She did not have any facial drooping or dysarthria.  ED Course:  Vital Signs: BP (!) 152/91 (BP Location: Right Arm)   Pulse 77   Temp 98.5 F (36.9 C) (Oral)   Resp 18   Ht 5\' 4"  (1.626 m)   Wt 104.3 kg (230 lb)   SpO2 98%   BMI 39.48 kg/m  CT head without contrast: Neg MRI brain without contrast: As above Lab data: Sodium 134, potassium 3.5, chloride 103, CO2 22, glucose 89, BUN 8, creatinine 0.97, LFTs normal, poc troponin 0.00, white count 7200 with normal differential, hemoglobin 13.6, MCV 76.9, platelets 324,000, coags normal Medications and treatments: None  Review of Systems:  In addition to the HPI above,  No Fever-chills, myalgias or other constitutional symptoms No Headache, changes with Vision or hearing, dizziness, dysarthria or word finding difficulty, tremors or seizure activity No problems swallowing food or Liquids, indigestion/reflux, choking or coughing while eating, abdominal pain with or after eating No Chest pain, Cough or Shortness of Breath, palpitations, orthopnea or DOE No Abdominal pain, N/V, melena,hematochezia, dark tarry stools, constipation No dysuria, malodorous urine, hematuria or flank pain No new skin rashes, lesions, masses or bruises, No  new joint pains, aches, swelling or redness No recent unintentional weight gain or loss No polyuria, polydypsia or polyphagia   Past Medical History:  Diagnosis Date  . Known health problems: none     Past Surgical History:  Procedure Laterality Date  . no prior surgery      Social History   Social History  . Marital status: Single    Spouse name: N/A  . Number of children: N/A  . Years of education: N/A   Occupational History  . Not on file.   Social History Main Topics  . Smoking status: Current Every Day Smoker    Packs/day: 1.00    Types: Cigarettes  . Smokeless tobacco: Never Used  . Alcohol use Yes     Comment: socially- not daily  . Drug use: No  . Sexual activity: Not on file   Other Topics Concern  . Not on file   Social History Narrative  . 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 Authorizing Provider  Ascorbic Acid (VITA-C PO) Take 1 tablet by mouth daily.   Yes [provider]  b complex vitamins capsule Take 1 capsule by mouth daily.   Yes [provider]  Cholecalciferol (VITAMIN D3 PO) Take 1 capsule by mouth daily.   Yes [provider]  glucosamine-chondroitin 500-400  MG tablet Take 1 tablet by mouth daily.   Yes [provider]  KRILL OIL PO Take 1 capsule by mouth daily.   Yes [provider]  Multiple Vitamin (MULTIVITAMIN WITH MINERALS) TABS tablet Take 1 tablet by mouth daily.   Yes [provider]  OVER THE COUNTER MEDICATION Take 3 capsules by mouth daily. QuadraLean Diet Pills   Yes [provider]  POTASSIUM PO Take 1 capsule by mouth daily.   Yes [provider]  Probiotic Product  (PROBIOTIC PO) Take 1 capsule by mouth daily.   Yes [provider]    Physical Exam: Vitals:   10/27/16 0542 10/27/16 0557 10/27/16 0600 10/27/16 0743  BP: (!) 152/96 (!) 161/99 (!) 167/88 (!) 152/91  Pulse: 79 79 83 77  Resp: Temp:  98 F (36.7 C)  98.5 F (36.9 C)  TempSrc:  Oral  Oral  SpO2: 100% 100% 98% 98%  Weight:      Height:          Constitutional: NAD, Anxious and tearful, comfortable Eyes: PERRL, lids and conjunctivae normal ENMT: Mucous membranes are moist. Posterior pharynx clear of any exudate or lesions.Normal dentition.  Neck: normal, supple, no masses, no thyromegaly Respiratory: clear to auscultation bilaterally, no wheezing, no crackles. Normal respiratory effort. No accessory muscle use.  Cardiovascular: Regular rate and rhythm, no murmurs / rubs / gallops. No extremity edema. 2+ pedal pulses. No carotid bruits.  Abdomen: no tenderness, no masses palpated. No hepatosplenomegaly. Bowel sounds positive.  Musculoskeletal: no clubbing / cyanosis. No joint deformity upper and lower extremities. Good ROM, no contractures. Normal muscle tone.  Skin: no rashes, lesions, ulcers. No induration Neurologic: CN 2-12 grossly intact. Sensation intact with mild persistent right-sided numbness/tingling, DTR normal. Strength 5/5 on the left, 4/5 on the right  Psychiatric: Normal judgment and insight. Alert and oriented x 3. Anxious and tearful mood.    Labs on Admission: I have personally reviewed following labs and imaging studies  CBC:  Recent Labs Lab 10/26/16 2225 10/26/16 2309  WBC 7.2  --   NEUTROABS 3.7  --   HGB 13.6 15.6*  HCT 41.5 46.0  MCV 76.9*  --   PLT 324  --    Basic Metabolic Panel:  Recent Labs Lab 10/26/16 2225 10/26/16 2309  NA 134* 138  K 3.5 4.2  CL 103 100*  CO2 22  --   GLUCOSE 89 90  BUN 8 10  CREATININE 0.97 0.90  CALCIUM 9.1  --    GFR: Estimated Creatinine Clearance: 94.8 mL/min (by C-G formula based  on SCr of 0.9 mg/dL). Liver Function Tests:  Recent Labs Lab 10/26/16 2225  AST 19  ALT 19  ALKPHOS 63  BILITOT 0.4  PROT 7.3  ALBUMIN 4.2   No results for input(s): LIPASE, AMYLASE in the last 168 hours. No results for input(s): AMMONIA in the last 168 hours. Coagulation Profile:  Recent Labs Lab 10/26/16 2225  INR 0.95   Cardiac Enzymes: No results for input(s): CKTOTAL, CKMB, CKMBINDEX, TROPONINI in the last 168 hours. BNP (last 3 results) No results for input(s): PROBNP in the last 8760 hours. HbA1C: No results for input(s): HGBA1C in the last 72 hours. CBG:  Recent Labs Lab 10/26/16 2221  GLUCAP 105*   Lipid Profile: No results for input(s): CHOL, HDL, LDLCALC, TRIG, CHOLHDL, LDLDIRECT in the last 72 hours. Thyroid Function Tests: No results for input(s): TSH, T4TOTAL, FREET4, T3FREE, THYROIDAB in  the last 72 hours. Anemia Panel: No results for input(s): VITAMINB12, FOLATE, FERRITIN, TIBC, IRON, RETICCTPCT in the last 72 hours. Urine analysis: No results found for: COLORURINE, APPEARANCEUR, LABSPEC, PHURINE, GLUCOSEU, HGBUR, BILIRUBINUR, KETONESUR, PROTEINUR, UROBILINOGEN, NITRITE, LEUKOCYTESUR Sepsis Labs: (procalcitonin:4,lacticidven:4) )No results found for this or any previous visit (from the past 240 hour(s)).   Radiological Exams on Admission: Ct Head Wo Contrast  Result Date: 10/26/2016 CLINICAL DATA:  Right arm and leg weakness since this evening. Paresthesias. EXAM: CT HEAD WITHOUT CONTRAST TECHNIQUE: Contiguous axial images were obtained from the base of the skull through the vertex without intravenous contrast. COMPARISON:  None. FINDINGS: Brain: No evidence of acute infarction, hemorrhage, hydrocephalus, extra-axial collection or mass lesion/mass effect. Vascular: No hyperdense vessel or unexpected calcification. Slight atherosclerosis of the carotid siphons bilaterally. Skull: Normal. Negative for fracture or focal lesion. Sinuses/Orbits:  No acute finding. Other: None. IMPRESSION: No acute intracranial abnormality. Electronically Signed   By: Tollie Eth M.D.   On: 10/26/2016 23:22   Mr Brain Wo Contrast  Result Date: 10/27/2016 CLINICAL DATA:  RIGHT-sided numbness and weakness. History of hypertension and obesity. EXAM: MRI HEAD WITHOUT CONTRAST TECHNIQUE: Multiplanar, multiecho pulse sequences of the brain and surrounding structures were obtained without intravenous contrast. COMPARISON:  CT HEAD October 26, 2016 FINDINGS: BRAIN: Subcentimeter reduced diffusion LEFT thalamus with low ADC values and faint FLAIR T2 hyperintense signal. No susceptibility artifact to suggest hemorrhage. The ventricles and sulci are normal for patient's age. Scattered subcentimeter supratentorial white matter FLAIR T2 hyperintensities. No suspicious parenchymal signal, mass or mass effect. No abnormal extra-axial fluid collections. VASCULAR: Normal major intracranial vascular flow voids present at skull base. SKULL AND UPPER CERVICAL SPINE: No abnormal sellar expansion. No suspicious calvarial bone marrow signal. Craniocervical junction maintained. SINUSES/ORBITS: The mastoid air-cells and included paranasal sinuses are well-aerated. The included ocular globes and orbital contents are non-suspicious. OTHER: None. IMPRESSION: 1. Acute LEFT thalamus lacunar infarct. 2. Mild white matter changes compatible with chronic small vessel ischemic disease, atypical distribution for demyelination. Electronically Signed   By: Awilda Metro M.D.   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 -Presented with acute onset right sided weakness without other symptoms at 9 PM on 9/10 with MRI demonstrating acute thalamic infarction -Still with persistent mild  right-sided weakness: arm worse than leg -CTA head/neck (patient had episode of claustrophobia during MRI so have opted to utilize CTA instead of MRA) -Echocardiogram/carotid duplex -FLP/HgbA1c -Frequent neurological checks -Antiplatelet with aspirin -PT/SLP/OT-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 -Anxiety component so have ordered low-dose Xanax prn and Ambien at HS prn -Likely will require long-term statin prophylaxis  Active Problems:   HTN (hypertension) -New diagnosis and possible etiology to stroke symptoms -Permissive hypertension: IV hydralazine prn SBP >/= 210 or DBP >/= 110    Obesity (BMI 30-39.9) -Weight reduction strategies -TSH nomal    Tobacco use disorder -Tobacco cessation      DVT prophylaxis: Lovenox  Code Status: Full  Family Communication: Family at bedside Disposition Plan: Home Consults called: Neurology/[place]    Russella Dar ANP-BC [facility] Pager [phone]   If 7PM-7AM, please contact night-coverage [email or web address] Password [facility]  10/27/2016, 8:27 AM

## 2016-10-27 NOTE — Progress Notes (Signed)
  Echocardiogram 2D Echocardiogram has been performed.  Alexandra Church 10/27/2016, 10:23 AM

## 2016-10-27 NOTE — Consult Note (Signed)
Physical Medicine and Rehabilitation Consult   Reason for Consult: stroke affecting mobility and ability to carry out ADL tasks.  Referring Physician: Dr.Merrell    HPI: Alexandra Church is a 46 y.o. female who was admitted on 10/26/16 with right sided weakness, numbness and difficulty walking.  MRI brain done revealing acute left thalamic infarct with chronic white matter changes. CTA head/neck showed no significant stenosis and somewhat diminutive VBS. 2D echo done revealing EF 65-70% with vigorous LVF and mild diastolic dysfunction. Carotid dopplers without significant stenosis. ASA recommended by neurology for secondary stroke prevention. Therapy evaluations done showing right lean with ataxic movements affecting mobility and ability to carry out ADL tasks. CIR recommended for follow up therapy.  Patient denies any numbness or tingling on the right side of her body. She does feel like her right arm is heavy.  Review of Systems  Constitutional: Negative for chills and fever.  HENT: Negative for hearing loss and tinnitus.   Eyes: Negative for blurred vision and double vision.  Respiratory: Negative for cough.   Cardiovascular: Negative for chest pain and palpitations.  Gastrointestinal: Negative for heartburn and nausea.  Genitourinary: Negative for frequency and urgency.  Musculoskeletal: Negative for back pain, myalgias and neck pain.  Skin: Negative for itching and rash.  Neurological: Positive for sensory change and focal weakness. Negative for dizziness and headaches.  Psychiatric/Behavioral: The patient is not nervous/anxious.       Past Medical History:  Diagnosis Date  . Known health problems: none     Past Surgical History:  Procedure Laterality Date  . no prior surgery      Family History  Problem Relation Age of Onset  . Hypertension Father   . High blood pressure Brother      Social History:  Single. Self employed--musician and teaches vocal lessons. She  reports that she has been smoking Cigarettes--1 PPD for past 4 years.  She has never used smokeless tobacco. She reports that she drinks alcohol--beer and mixed drink 3-4 times a week. She reports that she does not use drugs.    Allergies: No Known Allergies    Medications Prior to Admission  Medication Sig Dispense Refill  . Ascorbic Acid (VITA-C PO) Take 1 tablet by mouth daily.    Marland Kitchen b complex vitamins capsule Take 1 capsule by mouth daily.    . Cholecalciferol (VITAMIN D3 PO) Take 1 capsule by mouth daily.    Marland Kitchen glucosamine-chondroitin 500-400 MG tablet Take 1 tablet by mouth daily.    Marland Kitchen KRILL OIL PO Take 1 capsule by mouth daily.    . Multiple Vitamin (MULTIVITAMIN WITH MINERALS) TABS tablet Take 1 tablet by mouth daily.    Marland Kitchen OVER THE COUNTER MEDICATION Take 3 capsules by mouth daily. QuadraLean Diet Pills    . POTASSIUM PO Take 1 capsule by mouth daily.    . Probiotic Product (PROBIOTIC PO) Take 1 capsule by mouth daily.      Home: Home Living Family/patient expects to be discharged to:: Private residence Living Arrangements: Alone Available Help at Discharge: Family, Friend(s) Type of Home: House Home Access: Stairs to enter Secretary/administrator of Steps: 4 Entrance Stairs-Rails: Right Home Layout: One level Bathroom Shower/Tub: Engineer, manufacturing systems: Standard Home Equipment: None  Lives With: Alone  Functional History: Prior Function Level of Independence: Independent 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, teaches voice and piano lessons, and is a motivational speaker Functional Status:  Mobility: Bed Mobility Overal bed  mobility: Modified Independent Transfers Overall transfer level: Needs assistance Equipment used: 1 person hand held assist Transfers: Sit to/from Stand Sit to Stand: Min assist, Mod assist General transfer comment: upon initial standing, pt's right knee partially buckled and pt with posterior lean requiring return to sitting.  Demonstrated poor core stability with initial standing but progressed as she remained up Ambulation/Gait Ambulation/Gait assistance: Min assist, Mod assist Ambulation Distance (Feet): 125 Feet Assistive device: 2 person hand held assist Gait Pattern/deviations: Step-through pattern, Decreased weight shift to left, Staggering right, Ataxic General Gait Details: pt needing mod A first half of ambulation with significant R lean, improved with distance, needing min A last half of ambulation.  Gait velocity: decreased Gait velocity interpretation: Below normal speed for age/gender    ADL: ADL Overall ADL's : Needs assistance/impaired Eating/Feeding: Independent, Sitting Grooming: Standing, Minimal assistance Upper Body Bathing: Minimal assistance, Sitting Lower Body Bathing: Minimal assistance, Sit to/from stand Upper Body Dressing : Minimal assistance, Sitting Lower Body Dressing: Minimal assistance, Sit to/from stand Lower Body Dressing Details (indicate cue type and reason): donned socks without assist Toilet Transfer: Minimal assistance, Ambulation Toileting- Clothing Manipulation and Hygiene: Minimal assistance, Sit to/from stand Functional mobility during ADLs: Minimal assistance, Moderate assistance (initially requiring mod assist with R side lean, improved)  Cognition: Cognition Overall Cognitive Status: Within Functional Limits for tasks assessed Orientation Level: Oriented X4 Cognition Arousal/Alertness: Awake/alert Behavior During Therapy: WFL for tasks assessed/performed Overall Cognitive Status: Within Functional Limits for tasks assessed General Comments: pt tearful when ambulating   Blood pressure 139/83, pulse 84, temperature 98.4 F (36.9 C), temperature source Oral, resp. rate 18, height  (1.626 m), weight 104.3 kg (230 lb), last menstrual period 10/02/2016, SpO2 98 %. Physical Exam  Constitutional: She is oriented to person, place, and time. She appears  well-developed and well-nourished.  HENT:  Head: Normocephalic and atraumatic.  Eyes: Pupils are equal, round, and reactive to light. Conjunctivae are normal.  Neck: Normal range of motion. Neck supple.  Cardiovascular: Normal rate and regular rhythm.   Respiratory: Effort normal and breath sounds normal. No stridor. No respiratory distress. She has no wheezes.  GI: Soft. Bowel sounds are normal. She exhibits no distension. There is no tenderness.  Musculoskeletal: She exhibits no edema or tenderness.  Neurological: She is alert and oriented to person, place, and time.  Skin: Skin is warm and dry.  Psychiatric: She has a normal mood and affect. Her behavior is normal.  Motor strength is 5/5 in the left deltoid, biceps, triceps, grip, hip flexor, knee extensor, ankle dorsiflexor 4 plus in the right deltoid, biceps, triceps, grip 5/5 in the right hip flexor, knee extensor, ankle dorsal flexor. Sensation light touch is intact in bilateral upper and lower extremities. Speech without evidence of a aphasia  Results for orders placed or performed during the hospital encounter of 10/27/16 (from the past 24 hour(s))  CBG monitoring, ED     Status: Abnormal   Collection Time: 10/26/16 10:21 PM  Result Value Ref Range   Glucose-Capillary 105 (H) 65 - 99 mg/dL  Protime-INR     Status: None   Collection Time: 10/26/16 10:25 PM  Result Value Ref Range   Prothrombin Time 12.6 11.4 - 15.2 seconds   INR 0.95   APTT     Status: None   Collection Time: 10/26/16 10:25 PM  Result Value Ref Range   aPTT 30 24 - 36 seconds  CBC     Status: Abnormal   Collection Time:  10/26/16 10:25 PM  Result Value Ref Range   WBC 7.2 4.0 - 10.5 K/uL   RBC 5.40 (H) 3.87 - 5.11 MIL/uL   Hemoglobin 13.6 12.0 - 15.0 g/dL   HCT 96.0 45.4 - 09.8 %   MCV 76.9 (L) 78.0 - 100.0 fL   MCH 25.2 (L) 26.0 - 34.0 pg   MCHC 32.8 30.0 - 36.0 g/dL   RDW 11.9 14.7 - 82.9 %   Platelets 324 150 - 400 K/uL  Differential     Status:  None   Collection Time: 10/26/16 10:25 PM  Result Value Ref Range   Neutrophils Relative % 51 %   Neutro Abs 3.7 1.7 - 7.7 K/uL   Lymphocytes Relative 37 %   Lymphs Abs 2.7 0.7 - 4.0 K/uL   Monocytes Relative 10 %   Monocytes Absolute 0.7 0.1 - 1.0 K/uL   Eosinophils Relative 2 %   Eosinophils Absolute 0.1 0.0 - 0.7 K/uL   Basophils Relative 0 %   Basophils Absolute 0.0 0.0 - 0.1 K/uL  Comprehensive metabolic panel     Status: Abnormal   Collection Time: 10/26/16 10:25 PM  Result Value Ref Range   Sodium 134 (L) 135 - 145 mmol/L   Potassium 3.5 3.5 - 5.1 mmol/L   Chloride 103 101 - 111 mmol/L   CO2 22 22 - 32 mmol/L   Glucose, Bld 89 65 - 99 mg/dL   BUN 8 6 - 20 mg/dL   Creatinine, Ser 5.62 0.44 - 1.00 mg/dL   Calcium 9.1 8.9 - 13.0 mg/dL   Total Protein 7.3 6.5 - 8.1 g/dL   Albumin 4.2 3.5 - 5.0 g/dL   AST 19 15 - 41 U/L   ALT 19 14 - 54 U/L   Alkaline Phosphatase 63 38 - 126 U/L   Total Bilirubin 0.4 0.3 - 1.2 mg/dL   GFR calc non Af Amer >60 >60 mL/min   GFR calc Af Amer >60 >60 mL/min   Anion gap 9 5 - 15  I-stat troponin, ED     Status: None   Collection Time: 10/26/16 11:07 PM  Result Value Ref Range   Troponin i, poc 0.00 0.00 - 0.08 ng/mL   Comment 3          I-Stat Chem 8, ED     Status: Abnormal   Collection Time: 10/26/16 11:09 PM  Result Value Ref Range   Sodium 138 135 - 145 mmol/L   Potassium 4.2 3.5 - 5.1 mmol/L   Chloride 100 (L) 101 - 111 mmol/L   BUN 10 6 - 20 mg/dL   Creatinine, Ser 8.65 0.44 - 1.00 mg/dL   Glucose, Bld 90 65 - 99 mg/dL   Calcium, Ion 7.84 (L) 1.15 - 1.40 mmol/L   TCO2 31 22 - 32 mmol/L   Hemoglobin 15.6 (H) 12.0 - 15.0 g/dL   HCT 69.6 29.5 - 28.4 %  HIV antibody (Routine Testing)     Status: None   Collection Time: 10/27/16  8:21 AM  Result Value Ref Range   HIV Screen 4th Generation wRfx Non Reactive Non Reactive  TSH     Status: None   Collection Time: 10/27/16  8:21 AM  Result Value Ref Range   TSH 1.652 0.350 - 4.500  uIU/mL  Rapid urine drug screen (hospital performed)     Status: None   Collection Time: 10/27/16  1:05 PM  Result Value Ref Range   Opiates NONE DETECTED NONE DETECTED  Cocaine NONE DETECTED NONE DETECTED   Benzodiazepines NONE DETECTED NONE DETECTED   Amphetamines NONE DETECTED NONE DETECTED   Tetrahydrocannabinol NONE DETECTED NONE DETECTED   Barbiturates NONE DETECTED NONE DETECTED   Ct Angio Head W Or Wo Contrast  Result Date: 10/27/2016 CLINICAL DATA:  46 year old female with acute lacunar infarct in the lateral left thalamus on brain MRI today performed for right side numbness and weakness. EXAM: CT ANGIOGRAPHY HEAD AND NECK TECHNIQUE: Multidetector CT imaging of the head and neck was performed using the standard protocol during bolus administration of intravenous contrast. Multiplanar CT image reconstructions and MIPs were obtained to evaluate the vascular anatomy. Carotid stenosis measurements (when applicable) are obtained utilizing NASCET criteria, using the distal internal carotid diameter as the denominator. CONTRAST:  50 mL Isovue 370 COMPARISON:  Brain MRI 0459 hours today.  Head CT 10/26/2016. FINDINGS: CTA NECK Skeleton: Negative. Upper chest: Negative visualized mediastinum.  Negative lung apices. Other neck: Negative.  No cervical lymphadenopathy. Aortic arch: 3 vessel arch configuration. No arch atherosclerosis or great vessel origin stenosis. Right carotid system: Negative right CCA. Mild soft and calcified plaque at the posterior right ICA origin and bulb with no stenosis. Left carotid system: Negative. Vertebral arteries: No proximal right subclavian artery plaque or stenosis. Normal right vertebral artery origin. No right vertebral artery stenosis to the skullbase. No proximal left subclavian artery plaque or stenosis. Normal left vertebral artery origin. No left vertebral artery stenosis to the skullbase. CTA HEAD Posterior circulation: Codominant distal vertebral arteries  without stenosis. Normal left PICA origin. The right AICA appears dominant. No basilar artery stenosis. SCA and PCA origins are patent but somewhat diminutive. Both posterior communicating arteries are present. Bilateral PCA branches are within normal limits. Anterior circulation: There is minimal bilateral ICA siphon calcified plaque without stenosis. The ophthalmic and posterior communicating artery origins are normal. Patent carotid termini. Normal MCA and ACA origins. Anterior communicating artery and bilateral ACA branches are within normal limits. Left MCA M1 segment, bifurcation, and left MCA branches are within normal limits. Right MCA M1 segment, bifurcation, and right MCA branches are within normal limits. Venous sinuses: Patent on delayed images. Anatomic variants: Somewhat diminutive vertebrobasilar system, at least in part related to bilateral posterior communicating arteries contributing to the PCAs. Delayed phase: The left thalamic lacunar infarct remains occult by CT. Gray-white matter differentiation remains normal. No intracranial hemorrhage or mass effect. No abnormal enhancement identified. Review of the MIP images confirms the above findings IMPRESSION: 1. No emergent large vessel occlusion. 2. No posterior circulation atherosclerosis or stenosis identified. Somewhat diminutive vertebrobasilar system appears to be related to bilateral posterior communicating arteries contributing to the PCAs. 3. Mild right carotid bifurcation and minimal ICA siphon plaque without stenosis. 4. CT appearance of the brain remains normal. 5. No acute findings in the neck. Electronically Signed   By: Odessa Fleming M.D.   On: 10/27/2016 11:26   Ct Head Wo Contrast  Result Date: 10/26/2016 CLINICAL DATA:  Right arm and leg weakness since this evening. Paresthesias. EXAM: CT HEAD WITHOUT CONTRAST TECHNIQUE: Contiguous axial images were obtained from the base of the skull through the vertex without intravenous contrast.  COMPARISON:  None. FINDINGS: Brain: No evidence of acute infarction, hemorrhage, hydrocephalus, extra-axial collection or mass lesion/mass effect. Vascular: No hyperdense vessel or unexpected calcification. Slight atherosclerosis of the carotid siphons bilaterally. Skull: Normal. Negative for fracture or focal lesion. Sinuses/Orbits: No acute finding. Other: None. IMPRESSION: No acute intracranial abnormality. Electronically Signed   By:  Tollie Eth M.D.   On: 10/26/2016 23:22   Ct Angio Neck W Or Wo Contrast  Result Date: 10/27/2016 CLINICAL DATA:  46 year old female with acute lacunar infarct in the lateral left thalamus on brain MRI today performed for right side numbness and weakness. EXAM: CT ANGIOGRAPHY HEAD AND NECK TECHNIQUE: Multidetector CT imaging of the head and neck was performed using the standard protocol during bolus administration of intravenous contrast. Multiplanar CT image reconstructions and MIPs were obtained to evaluate the vascular anatomy. Carotid stenosis measurements (when applicable) are obtained utilizing NASCET criteria, using the distal internal carotid diameter as the denominator. CONTRAST:  50 mL Isovue 370 COMPARISON:  Brain MRI 0459 hours today.  Head CT 10/26/2016. FINDINGS: CTA NECK Skeleton: Negative. Upper chest: Negative visualized mediastinum.  Negative lung apices. Other neck: Negative.  No cervical lymphadenopathy. Aortic arch: 3 vessel arch configuration. No arch atherosclerosis or great vessel origin stenosis. Right carotid system: Negative right CCA. Mild soft and calcified plaque at the posterior right ICA origin and bulb with no stenosis. Left carotid system: Negative. Vertebral arteries: No proximal right subclavian artery plaque or stenosis. Normal right vertebral artery origin. No right vertebral artery stenosis to the skullbase. No proximal left subclavian artery plaque or stenosis. Normal left vertebral artery origin. No left vertebral artery stenosis to the  skullbase. CTA HEAD Posterior circulation: Codominant distal vertebral arteries without stenosis. Normal left PICA origin. The right AICA appears dominant. No basilar artery stenosis. SCA and PCA origins are patent but somewhat diminutive. Both posterior communicating arteries are present. Bilateral PCA branches are within normal limits. Anterior circulation: There is minimal bilateral ICA siphon calcified plaque without stenosis. The ophthalmic and posterior communicating artery origins are normal. Patent carotid termini. Normal MCA and ACA origins. Anterior communicating artery and bilateral ACA branches are within normal limits. Left MCA M1 segment, bifurcation, and left MCA branches are within normal limits. Right MCA M1 segment, bifurcation, and right MCA branches are within normal limits. Venous sinuses: Patent on delayed images. Anatomic variants: Somewhat diminutive vertebrobasilar system, at least in part related to bilateral posterior communicating arteries contributing to the PCAs. Delayed phase: The left thalamic lacunar infarct remains occult by CT. Gray-white matter differentiation remains normal. No intracranial hemorrhage or mass effect. No abnormal enhancement identified. Review of the MIP images confirms the above findings IMPRESSION: 1. No emergent large vessel occlusion. 2. No posterior circulation atherosclerosis or stenosis identified. Somewhat diminutive vertebrobasilar system appears to be related to bilateral posterior communicating arteries contributing to the PCAs. 3. Mild right carotid bifurcation and minimal ICA siphon plaque without stenosis. 4. CT appearance of the brain remains normal. 5. No acute findings in the neck. Electronically Signed   By: Odessa Fleming M.D.   On: 10/27/2016 11:26   Mr Brain Wo Contrast  Result Date: 10/27/2016 CLINICAL DATA:  RIGHT-sided numbness and weakness. History of hypertension and obesity. EXAM: MRI HEAD WITHOUT CONTRAST TECHNIQUE: Multiplanar, multiecho  pulse sequences of the brain and surrounding structures were obtained without intravenous contrast. COMPARISON:  CT HEAD October 26, 2016 FINDINGS: BRAIN: Subcentimeter reduced diffusion LEFT thalamus with low ADC values and faint FLAIR T2 hyperintense signal. No susceptibility artifact to suggest hemorrhage. The ventricles and sulci are normal for patient's age. Scattered subcentimeter supratentorial white matter FLAIR T2 hyperintensities. No suspicious parenchymal signal, mass or mass effect. No abnormal extra-axial fluid collections. VASCULAR: Normal major intracranial vascular flow voids present at skull base. SKULL AND UPPER CERVICAL SPINE: No abnormal sellar expansion. No suspicious  calvarial bone marrow signal. Craniocervical junction maintained. SINUSES/ORBITS: The mastoid air-cells and included paranasal sinuses are well-aerated. The included ocular globes and orbital contents are non-suspicious. OTHER: None. IMPRESSION: 1. Acute LEFT thalamus lacunar infarct. 2. Mild white matter changes compatible with chronic small vessel ischemic disease, atypical distribution for demyelination. Electronically Signed   By: Awilda Metroourtnay  Bloomer M.D.   On: 10/27/2016 05:34    Assessment/Plan: Diagnosis: Left thalamic infarct. Minimal right hemiparesis 1. Does the need for close, 24 hr/day medical supervision in concert with the patient's rehab needs make it unreasonable for this patient to be served in a less intensive setting? No 2. Co-Morbidities requiring supervision/potential complications: none 3. Due to patient education, does the patient require 24 hr/day rehab nursing? No 4. Does the patient require coordinated care of a physician, rehab nurse, PT, OT to address physical and functional deficits in the context of the above medical diagnosis(es)? No Addressing deficits in the following areas: balance, transferring, dressing and toileting 5. Can the patient actively participate in an intensive therapy  program of at least 3 hrs of therapy per day at least 5 days per week? No 6. The potential for patient to make measurable gains while on inpatient rehab is fair 7. Anticipated functional outcomes upon discharge from inpatient rehab are n/a  with PT, n/a with OT, n/a with SLP. 8. Estimated rehab length of stay to reach the above functional goals is: Not applicable 9. Anticipated D/C setting: Home 10. Anticipated post D/C treatments: HH therapy 11. Overall Rehab/Functional Prognosis: excellent  RECOMMENDATIONS: This patient's condition is appropriate for continued rehabilitative care [facility] the following [facility] Patient has agreed to participate in recommended program. Yes Note that insurance prior authorization may be required for reimbursement for recommended care.  Comment:   Erick ColaceAndrew E. Kirsteins M.D. [facility] FAAPM&R (Sports Med, Neuromuscular Med) Diplomate Am Board of Electrodiagnostic Med  Jerene PitchLove, Pamela S, PA-C 10/27/2016

## 2016-10-27 NOTE — Plan of Care (Signed)
Ms. Alexandra Church is a 46 year old female with pmh of hypertension and obesity; who presents with complaints of right-sided numbness/weakness. VS: T- afebrile, pulse 79-103, respirations 16-33, blood pressure 142/102- 165/108, and O2 saturations maintained on RA. Lab work was relatively unremarkable. CT scan of the brain negative. Neurology was consulted, and MRI ordered. Admit to a telemetry bed.

## 2016-10-27 NOTE — Progress Notes (Signed)
PHARMACIST - PHYSICIAN ORDER COMMUNICATION  CONCERNING: P&T Medication Policy on Herbal Medications  DESCRIPTION:  This patient's order for:  Glucosamine-Chondroitin  has been noted.  This product(s) is classified as an "herbal" or natural product. Due to a lack of definitive safety studies or FDA approval, nonstandard manufacturing practices, plus the potential risk of unknown drug-drug interactions while on inpatient medications, the Pharmacy and Therapeutics Committee does not permit the use of "herbal" or natural products of this type within [place].   ACTION TAKEN: The pharmacy department is unable to verify this order at this time and your patient has been informed of this safety policy. Please reevaluate patient's clinical condition at discharge and address if the herbal or natural product(s) should be resumed at that time.   

## 2016-10-27 NOTE — Progress Notes (Signed)
*  PRELIMINARY RESULTS* Vascular Ultrasound Carotid Duplex (Doppler) has been completed.  Preliminary findings: Bilateral 1-39% ICA stenosis, antegrade vertebral flow.   Chauncey FischerCharlotte C Luian Schumpert 10/27/2016, 9:59 AM

## 2016-10-27 NOTE — Progress Notes (Signed)
Rehab Admissions Coordinator Note:  Patient was screened by Clois DupesBoyette, Dejuan Elman Godwin for appropriateness for an Inpatient Acute Rehab Consult per OT recommendation. At this time, we are recommending Inpatient Rehab consult.  Clois DupesBoyette, Tryniti Laatsch Godwin 10/27/2016, 3:08 PM  I can be reached at [phone].

## 2016-10-27 NOTE — ED Notes (Signed)
Patient transported to MRI 

## 2016-10-27 NOTE — Evaluation (Signed)
Occupational Therapy Evaluation Patient Details Name: Alexandra Church MRN: [id number] DOB: Aug 26, 1970 Today's Date: 10/27/2016    History of Present Illness Pt admitted with R side weakness, MRI + L thalamus lacunar infart. PMH: tobacco use, HTN   Clinical Impression   Pt was independent at baseline. Performing self care at a min assist level. Pt with inconsistencies with sit to stand and while ambulating, requiring mod to min assist. Demonstrates incoordination in R hand. Anticipate pt will progress well. Will follow acutely.    Follow Up Recommendations  CIR;Supervision/Assistance - 24 hour (may progress to home)    Equipment Recommendations  3 in 1 bedside commode    Recommendations for Other Services Rehab consult     Precautions / Restrictions Precautions Precautions: Fall      Mobility Bed Mobility Overal bed mobility: Modified Independent                Transfers Overall transfer level: Needs assistance   Transfers: Sit to/from Stand Sit to Stand: Min assist;Mod assist         General transfer comment: upon initially standing, knees buckled requiring pt to return to sitting, upon second stand needed min assist to rise and steady prior to ambulating    Balance Overall balance assessment: Needs assistance   Sitting balance-Leahy Scale: Good       Standing balance-Leahy Scale: Poor Standing balance comment: needs at least one hand support to stabilize in static standing                           ADL either performed or assessed with clinical judgement   ADL Overall ADL's : Needs assistance/impaired Eating/Feeding: Independent;Sitting   Grooming: Standing;Minimal assistance   Upper Body Bathing: Minimal assistance;Sitting   Lower Body Bathing: Minimal assistance;Sit to/from stand   Upper Body Dressing : Minimal assistance;Sitting   Lower Body Dressing: Minimal assistance;Sit to/from stand Lower Body Dressing Details (indicate cue type  and reason): donned socks without assist Toilet Transfer: Minimal assistance;Ambulation   Toileting- Clothing Manipulation and Hygiene: Minimal assistance;Sit to/from stand       Functional mobility during ADLs: Minimal assistance;Moderate assistance (initially requiring mod assist with R side lean, improved)       Vision Baseline Vision/History: Wears glasses Wears Glasses: At all times Patient Visual Report: No change from baseline       Perception     Praxis      Pertinent Vitals/Pain Pain Assessment: No/denies pain     Hand Dominance Right   Extremity/Trunk Assessment Upper Extremity Assessment Upper Extremity Assessment: RUE deficits/detail RUE Deficits / Details: 4+5, incoordination, noted to posture in flexion with walking RUE Coordination: decreased fine motor   Lower Extremity Assessment Lower Extremity Assessment: Defer to PT evaluation   Cervical / Trunk Assessment Cervical / Trunk Assessment: Normal   Communication Communication Communication: No difficulties   Cognition Arousal/Alertness: Awake/alert Behavior During Therapy: WFL for tasks assessed/performed Overall Cognitive Status: Within Functional Limits for tasks assessed                                     General Comments       Exercises     Shoulder Instructions      Home Living Family/patient expects to be discharged to:: Private residence Living Arrangements: Alone Available Help at Discharge: Family;Friend(s) (pt stating she has "options") Type of Home: House  Home Access: Stairs to enter Entrance Stairs-Number of Steps: 4 Entrance Stairs-Rails: Right Home Layout: One level     Bathroom Shower/Tub: Chief Strategy OfficerTub/shower unit   Bathroom Toilet: Standard     Home Equipment: None          Prior Functioning/Environment Level of Independence: Independent        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        OT Problem List: Decreased strength;Decreased  activity tolerance;Impaired balance (sitting and/or standing);Decreased coordination;Decreased knowledge of use of DME or AE;Obesity;Impaired UE functional use      OT Treatment/Interventions: Self-care/ADL training;DME and/or AE instruction;Patient/family education;Balance training;Therapeutic activities;Neuromuscular education    OT Goals(Current goals can be found in the care plan section) Acute Rehab OT Goals Patient Stated Goal: to return to playing piano OT Goal Formulation: With patient Time For Goal Achievement: 11/10/16 Potential to Achieve Goals: Good ADL Goals Pt Will Perform Grooming: standing;with modified independence Pt Will Perform Upper Body Bathing: standing;with modified independence Pt Will Perform Lower Body Bathing: with modified independence;sit to/from stand Pt Will Perform Upper Body Dressing: with modified independence;sitting Pt Will Perform Lower Body Dressing: with modified independence;sit to/from stand Pt Will Transfer to Toilet: with modified independence;ambulating;regular height toilet Pt Will Perform Toileting - Clothing Manipulation and hygiene: with modified independence;sit to/from stand Pt Will Perform Tub/Shower Transfer: Tub transfer;ambulating;with supervision  OT Frequency: Min 3X/week   Barriers to D/C:            Co-evaluation              AM-PAC PT "6 Clicks" Daily Activity     Outcome Measure Help from another person eating meals?: None Help from another person taking care of personal grooming?: A Little Help from another person toileting, which includes using toliet, bedpan, or urinal?: A Little Help from another person bathing (including washing, rinsing, drying)?: A Little Help from another person to put on and taking off regular upper body clothing?: A Little Help from another person to put on and taking off regular lower body clothing?: A Little 6 Click Score: 19   End of Session Equipment Utilized During Treatment: Gait  belt Nurse Communication: Mobility status  Activity Tolerance: Patient tolerated treatment well Patient left: in bed;with call bell/phone within reach;with family/visitor present  OT Visit Diagnosis: Unsteadiness on feet (R26.81);Other abnormalities of gait and mobility (R26.89);Hemiplegia and hemiparesis;Muscle weakness (generalized) (M62.81) Hemiplegia - Right/Left: Right Hemiplegia - dominant/non-dominant: Dominant Hemiplegia - caused by: Cerebral infarction                Time: 1610-96041416-1442 OT Time Calculation (min): 26 min Charges:  OT General Charges $OT Visit: 1 Visit OT Evaluation $OT Eval Moderate Complexity: 1 Mod G-Codes: OT G-codes **NOT FOR INPATIENT CLASS** Functional Assessment Tool Used: Clinical judgement Functional Limitation: Self care Self Care Current Status (V4098(G8987): At least 20 percent but less than 40 percent impaired, limited or restricted Self Care Goal Status (J1914(G8988): At least 1 percent but less than 20 percent impaired, limited or restricted   10/27/2016 Martie RoundJulie Athleen Feltner, OTR/L Pager: [phone] Evern BioMayberry, Kham Zuckerman Lynn 10/27/2016, 3:01 PM

## 2016-10-28 DIAGNOSIS — I639 Cerebral infarction, unspecified: Principal | ICD-10-CM | POA: Diagnosis present

## 2016-10-28 DIAGNOSIS — R269 Unspecified abnormalities of gait and mobility: Secondary | ICD-10-CM

## 2016-10-28 DIAGNOSIS — I69398 Other sequelae of cerebral infarction: Secondary | ICD-10-CM

## 2016-10-28 LAB — LIPID PANEL
CHOL/HDL RATIO: 4.2 ratio
CHOLESTEROL: 187 mg/dL (ref 0–200)
HDL: 45 mg/dL (ref >40)
LDL Cholesterol: 117 mg/dL — ABNORMAL HIGH (ref 0–99)
Triglycerides: 125 mg/dL (ref <150)
VLDL: 25 mg/dL (ref 0–40)

## 2016-10-28 LAB — HEMOGLOBIN A1C
HEMOGLOBIN A1C: 6.3 % — AB (ref 4.8–5.6)
MEAN PLASMA GLUCOSE: 134.11 mg/dL

## 2016-10-28 NOTE — Progress Notes (Signed)
Occupational Therapy Treatment Patient Details Name: Alexandra Church MRN: [id number] DOB: 01/09/1971 Today's Date: 10/28/2016    History of present illness Pt admitted with R side weakness, MRI + L thalamus lacunar infart. PMH: tobacco use, HTN   OT comments  Pt is performing self care at a supervision level. Pt prefers using walker, reaches for furniture and is considerably slower without it. Pt did not have any coordination issues with toothbrushing, dressing, drinking or using her smart phone. Updated d/c recommendations to OPOT if coordination issues interfere with function.  Follow Up Recommendations  Outpatient OT    Equipment Recommendations  3 in 1 bedside commode    Recommendations for Other Services      Precautions / Restrictions Precautions Precautions: Fall Restrictions Weight Bearing Restrictions: No       Mobility Bed Mobility Overal bed mobility: Modified Independent                Transfers Overall transfer level: Needs assistance Equipment used: None;Rolling walker (2 wheeled) Transfers: Sit to/from Stand Sit to Stand: Supervision         General transfer comment: supervision for safety, cues for hand placemen with walker    Balance Overall balance assessment: Needs assistance   Sitting balance-Leahy Scale: Good     Standing balance support: No upper extremity supported Standing balance-Leahy Scale: Fair Standing balance comment: no UE support needed at sink                            ADL either performed or assessed with clinical judgement   ADL Overall ADL's : Needs assistance/impaired     Grooming: Supervision/safety;Standing;Oral care Grooming Details (indicate cue type and reason): able to release walker in standing at sink without LOB, no difficulty manipulating toothbrush or paste with R hand         Upper Body Dressing : Set up;Sitting Upper Body Dressing Details (indicate cue type and reason): includind tying   Lower Body Dressing: Supervision/safety;Sit to/from stand Lower Body Dressing Details (indicate cue type and reason): donned socks without assist Toilet Transfer: Supervision/safety;Ambulation;RW   Toileting- Clothing Manipulation and Hygiene: Supervision/safety;Sit to/from stand       Functional mobility during ADLs: Supervision/safety;Rolling walker;Minimal assistance (supervision with RW, min guard without) General ADL Comments: Pt using R hand on cell phone and to drink without difficulty     Vision       Perception     Praxis      Cognition Arousal/Alertness: Awake/alert Behavior During Therapy: WFL for tasks assessed/performed Overall Cognitive Status: Within Functional Limits for tasks assessed                                          Exercises     Shoulder Instructions       General Comments      Pertinent Vitals/ Pain       Pain Assessment: No/denies pain  Home Living                                          Prior Functioning/Environment              Frequency  Min 3X/week        Progress Toward Goals  OT  Goals(current goals can now be found in the care plan section)  Progress towards OT goals: Progressing toward goals  Acute Rehab OT Goals Patient Stated Goal: to return to playing piano OT Goal Formulation: With patient Time For Goal Achievement: 11/10/16 Potential to Achieve Goals: Good  Plan Discharge plan needs to be updated    Co-evaluation                 AM-PAC PT "6 Clicks" Daily Activity     Outcome Measure   Help from another person eating meals?: None Help from another person taking care of personal grooming?: A Little Help from another person toileting, which includes using toliet, bedpan, or urinal?: A Little Help from another person bathing (including washing, rinsing, drying)?: A Little Help from another person to put on and taking off regular upper body clothing?:  None Help from another person to put on and taking off regular lower body clothing?: A Little 6 Click Score: 20    End of Session Equipment Utilized During Treatment: Gait belt;Rolling walker  OT Visit Diagnosis: Unsteadiness on feet (R26.81);Other abnormalities of gait and mobility (R26.89);Hemiplegia and hemiparesis;Muscle weakness (generalized) (M62.81)   Activity Tolerance Patient tolerated treatment well   Patient Left in chair;with call bell/phone within reach;with family/visitor present   Nurse Communication Mobility status (pt wants to shower)        Time: [id number] OT Time Calculation (min): 20 min  Charges: OT General Charges $OT Visit: 1 Visit OT Treatments $Self Care/Home Management : 8-22 mins  10/28/2016 Martie RoundJulie Giovanni Church, OTR/L Pager: [phone] Alexandra PlanasMayberry, Alexandra BailiffJulie Church 10/28/2016, 11:10 AM

## 2016-10-28 NOTE — Progress Notes (Signed)
Physical Therapy Treatment Patient Details Name: Alexandra Church MRN: [id number] DOB: 13-Jan-1971 Today's Date: 10/28/2016    History of Present Illness Pt admitted with R side weakness, MRI + L thalamus lacunar infart. PMH: tobacco use, HTN    PT Comments    Improving steadily.  Emphasis on static/dynamic balance using BERG activities, gait training stressing heel/toe pattern.  General stroke education.   Follow Up Recommendations  Outpatient PT     Equipment Recommendations  Other (comment)    Recommendations for Other Services       Precautions / Restrictions Precautions Precautions: Fall    Mobility  Bed Mobility Overal bed mobility: Modified Independent                Transfers Overall transfer level: Needs assistance   Transfers: Sit to/from Stand Sit to Stand: Supervision            Ambulation/Gait Ambulation/Gait assistance: Min assist;Min guard Ambulation Distance (Feet): 200 Feet (total with 10 or more rests to regroup)   Gait Pattern/deviations: Step-through pattern Gait velocity: decreased Gait velocity interpretation: Below normal speed for age/gender (but can speed up to cuing) General Gait Details: Practiced heel toe pattern which pt could "hold" for a length of time before fatigue and degradation, which happened more frequently with fatigue.  Good steady pattern for short periods.   Stairs            Wheelchair Mobility    Modified Rankin (Stroke Patients Only) Modified Rankin (Stroke Patients Only) Pre-Morbid Rankin Score: No symptoms Modified Rankin: Moderately severe disability     Balance Overall balance assessment: Needs assistance   Sitting balance-Leahy Scale: Good     Standing balance support: No upper extremity supported Standing balance-Leahy Scale: Good Standing balance comment: Used BERG activities for practice/coordination without assist                 Standardized Balance Assessment Standardized  Balance Assessment : Berg Balance Test Berg Balance Test Sit to Stand: Able to stand without using hands and stabilize independently Standing Unsupported: Able to stand safely 2 minutes Sitting with Back Unsupported but Feet Supported on Floor or Stool: Able to sit safely and securely 2 minutes Stand to Sit: Sits safely with minimal use of hands Transfers: Able to transfer safely, definite need of hands Standing Unsupported with Eyes Closed: Able to stand 10 seconds with supervision Standing Ubsupported with Feet Together: Able to place feet together independently and stand for 1 minute with supervision From Standing Position, Pick up Object from Floor: Able to pick up shoe, needs supervision From Standing Position, Turn to Look Behind Over each Shoulder: Looks behind from both sides and weight shifts well Turn 360 Degrees: Able to turn 360 degrees safely one side only in 4 seconds or less Standing Unsupported, One Foot in Front: Able to place foot tandem independently and hold 30 seconds Standing on One Leg: Able to lift leg independently and hold > 10 seconds        Cognition Arousal/Alertness: Awake/alert Behavior During Therapy: WFL for tasks assessed/performed Overall Cognitive Status: Within Functional Limits for tasks assessed                                        Exercises      General Comments General comments (skin integrity, edema, etc.): Discussed normalized movement and need to stop when movement is sloppy or  uncoordinated.  Gave activities to try for self therapy.      Pertinent Vitals/Pain Pain Assessment: No/denies pain    Home Living                      Prior Function            PT Goals (current goals can now be found in the care plan section) Acute Rehab PT Goals Patient Stated Goal: to return to playing piano PT Goal Formulation: With patient Time For Goal Achievement: 11/10/16 Potential to Achieve Goals: Good Progress  towards PT goals: Progressing toward goals    Frequency    Min 4X/week      PT Plan Current plan remains appropriate    Co-evaluation              AM-PAC PT "6 Clicks" Daily Activity  Outcome Measure  Difficulty turning over in bed (including adjusting bedclothes, sheets and blankets)?: None Difficulty moving from lying on back to sitting on the side of the bed? : None Difficulty sitting down on and standing up from a chair with arms (e.g., wheelchair, bedside commode, etc,.)?: None Help needed moving to and from a bed to chair (including a wheelchair)?: A Little Help needed walking in hospital room?: A Little Help needed climbing 3-5 steps with a railing? : A Little 6 Click Score: 21    End of Session   Activity Tolerance: Patient tolerated treatment well Patient left: in chair;with call bell/phone within reach;with family/visitor present Nurse Communication: Mobility status PT Visit Diagnosis: Unsteadiness on feet (R26.81);Other abnormalities of gait and mobility (R26.89);Hemiplegia and hemiparesis Hemiplegia - Right/Left: Right Hemiplegia - dominant/non-dominant: Dominant Hemiplegia - caused by: Cerebral infarction     Time: 1641-1710 PT Time Calculation (min) (ACUTE ONLY): 29 min  Charges:  $Gait Training: 8-22 mins $Neuromuscular Re-education: 8-22 mins                    G Codes:       10/28/2016  [place] BingKen Kelan Church, PT [phone]  (pager)   Alexandra GumKenneth V Anushka Church 10/28/2016, 5:32 PM

## 2016-10-28 NOTE — Progress Notes (Signed)
PROGRESS NOTE    Alexandra Church  ZOX:[id number] DOB: 11/06/1970 DOA: 10/27/2016 PCP: Patient, No Pcp Per    Brief Narrative:  46 year old female who presented with acute right-sided weakness. Patient is known to have a significant history of tobacco abuse, she developed acute onset of right-sided weakness, associated with paresthesias. After 12 hours of  Persistent symptoms, she presented to hospital for further evaluation. On initial physical examination, blood pressure 152/91, heart rate 77, temperature 98.5, respiratory rate 18, oxygen saturation 98%. Moist mucous membranes, lungs were clear to auscultation bilaterally, no wheezing, rales or rhonchi, heart S1-S2 present rhythmic, no gallops, rubs or murmurs, abdomen was soft nontender, no lower extremity edema. Brain MRI with left thalamic lacunar infarct. EKG was normal sinus rhythm, normal intervals, normal axis.  Patient admitted to the hospital with acute ischemic infarct, acute left thalamic.  Assessment & Plan:   Principal Problem:   Acute thalamic infarction [facility](HCC) Active Problems:   Right sided weakness   HTN (hypertension)   Obesity (BMI 30-39.9)   Tobacco use disorder   1. Acute thalamic infarct. Right hemiparesis, acute left thalamic lacunar infarct. Will continue full dose aspirin and high potency statin therapy (atorvastatin), physical therapy and occupational therapy final recommendations, for home health and home pt. Will arrange for discharge in am. Patient will need a walker and bedside commode.   2. Hypertension. Blood pressure controled with 116 to 126 systolic, will continue close monitoring, for now holding on antihypertensive agents, at home on not antihypertensive therapy.   3. Obesity. Will need close follow up as outpatient.   5. Tobacco abuse. Smoking cessation   DVT prophylaxis: enoxaparin  Code Status: full Family Communication: I spoke with patient's family at the bedside and all questions were  addressed Disposition Plan: Home with home health.    Consultants:   Neurology   Procedures:     Antimicrobials:       Subjective: Patient feeling better, noted persistent weakness on the right upper and lower extremities, with positive paresthesias at the right face, no nausea or vomiting, no chest pain or dyspnea.   Objective: Vitals:   10/27/16 1400 10/27/16 1630 10/27/16 1807 10/28/16 0620  BP: 139/83 (!) 143/89 (!) 131/94 116/75  Pulse: 84 78 75 68  Resp: 18 18 20 20   Temp: 98.4 F (36.9 C) 98.4 F (36.9 C) 98.3 F (36.8 C) 98.1 F (36.7 C)  TempSrc: Oral Oral Oral Oral  SpO2: 98% 97% 98% 99%  Weight:    99.4 kg (219 lb 3.2 oz)  Height:        Intake/Output Summary (Last 24 hours) at 10/28/16 1230 Last data filed at 10/28/16 0947  Gross per 24 hour  Intake              180 ml  Output             1100 ml  Net             -920 ml   Filed Weights   10/26/16 2215 10/28/16 0620  Weight: 104.3 kg (230 lb) 99.4 kg (219 lb 3.2 oz)    Examination:  General: Not in pain or dyspnea Neurology: Awake and alert, decreased strength on the right upper and lower extremities.  E ENT: no pallor, no icterus, oral mucosa moist Cardiovascular: S1-S2 present, rhythmic, no gallops, rubs, or murmurs. No jugular venous distention, no lower extremity edema. Pulmonary: vesicular breath sounds bilaterally, adequate air movement, no wheezing, rhonchi or rales. Gastrointestinal. Abdomen flat,  no organomegaly, non tender, no rebound or guarding Skin. No rashes Musculoskeletal: no joint deformities     Data Reviewed: I have personally reviewed following labs and imaging studies  CBC:  Recent Labs Lab 10/26/16 2225 10/26/16 2309  WBC 7.2  --   NEUTROABS 3.7  --   HGB 13.6 15.6*  HCT 41.5 46.0  MCV 76.9*  --   PLT 324  --    Basic Metabolic Panel:  Recent Labs Lab 10/26/16 2225 10/26/16 2309  NA 134* 138  K 3.5 4.2  CL 103 100*  CO2 22  --   GLUCOSE 89 90   BUN 8 10  CREATININE 0.97 0.90  CALCIUM 9.1  --    GFR: Estimated Creatinine Clearance: 92.4 mL/min (by C-G formula based on SCr of 0.9 mg/dL). Liver Function Tests:  Recent Labs Lab 10/26/16 2225  AST 19  ALT 19  ALKPHOS 63  BILITOT 0.4  PROT 7.3  ALBUMIN 4.2   No results for input(s): LIPASE, AMYLASE in the last 168 hours. No results for input(s): AMMONIA in the last 168 hours. Coagulation Profile:  Recent Labs Lab 10/26/16 2225  INR 0.95   Cardiac Enzymes: No results for input(s): CKTOTAL, CKMB, CKMBINDEX, TROPONINI in the last 168 hours. BNP (last 3 results) No results for input(s): PROBNP in the last 8760 hours. HbA1C:  Recent Labs  10/28/16 0600  HGBA1C 6.3*   CBG:  Recent Labs Lab 10/26/16 2221  GLUCAP 105*   Lipid Profile:  Recent Labs  10/28/16 0600  CHOL 187  HDL 45  LDLCALC 117*  TRIG 125  CHOLHDL 4.2   Thyroid Function Tests:  Recent Labs  10/27/16 0821  TSH 1.652   Anemia Panel: No results for input(s): VITAMINB12, FOLATE, FERRITIN, TIBC, IRON, RETICCTPCT in the last 72 hours.    Radiology Studies: I have reviewed all of the imaging during this hospital visit personally     Scheduled Meds: . aspirin  300 mg Rectal Daily   Or  . aspirin  325 mg Oral Daily  . atorvastatin  40 mg Oral q1800  . B-complex with vitamin C  1 tablet Oral Daily  . cholecalciferol  1,000 Units Oral Daily  . enoxaparin (LOVENOX) injection  40 mg Subcutaneous Q24H  . LORazepam  1 mg Intramuscular Once  . multivitamin with minerals  1 tablet Oral Daily   Continuous Infusions:   LOS: 0 days        [name], MD [facility] Pager [phone]

## 2016-10-28 NOTE — Progress Notes (Signed)
Rehab admissions - Please see rehab consult done today by Dr. Wynn BankerKirsteins [facility].  Therapy recommendations have now changed to outpatient therapy since patient is doing very well with mobility.  Call me for questions.  #[phone]

## 2016-10-29 MED ORDER — ATORVASTATIN CALCIUM 40 MG PO TABS: 40.0000 mg | ORAL_TABLET | Freq: Every day | ORAL | 0 refills | Status: DC

## 2016-10-29 MED ORDER — NICOTINE 21 MG/24HR TD PT24: 21.0000 mg | MEDICATED_PATCH | TRANSDERMAL | 1 refills | Status: DC

## 2016-10-29 MED ORDER — ASPIRIN 325 MG PO TABS: 325.0000 mg | ORAL_TABLET | Freq: Every day | ORAL | 0 refills | Status: DC

## 2016-10-29 MED ORDER — RANITIDINE HCL 150 MG PO TABS: 150.0000 mg | ORAL_TABLET | Freq: Every day | ORAL | 0 refills | Status: AC

## 2016-10-29 NOTE — Care Management Note (Addendum)
Case Management Note  Patient Details  Name: Pricilla Larssonanya Zou MRN: [id number] Date of Birth: 12/28/1970  Subjective/Objective:     Admitted with CVA. Resides alone. Pt without PCP and health insurance.   Action/Plan: Plan is to d/c to home today. Pt's plan is to live with mom once d/c: [address], [place],[address]. Pt's cell # , [phone]. Post hospital f/u scheduled for 11/20/2016 @ 1pm [facility] HandlerLaChina [facility] Per PT/OT evals/ recommendation: outpatient PT/OT. CM made referral with [facility], refer to [facility], for outpatient therapies. GoodRx coupon given to pt to offset Rx cost.  Expected Discharge Date:  10/29/16               Expected Discharge Plan:  Home/Self Care  In-House Referral:     Discharge planning Services  CM Consult, [facility], Follow-up appt scheduled  Post Acute Care Choice:    Choice offered to:     DME Arranged:   rolling walker DME Agency:    [facility]  HH Arranged:    HH Agency:     Status of Service:  Completed, signed off  If discussed at Long Length of Stay Meetings, dates discussed:    Additional Comments:  Epifanio LeschesCole, Samson Ralph Hudson, RN 10/29/2016, 11:11 AM

## 2016-10-29 NOTE — Progress Notes (Signed)
Pricilla Larsson to be D/C'd Home per MD order.  Discussed with the patient and all questions fully answered.  VSS, Skin clean, dry and intact without evidence of skin break down, no evidence of skin tears noted. IV catheter discontinued intact. Site without signs and symptoms of complications. Dressing and pressure applied.  An After Visit Summary was printed and given to the patient. Patient received prescription.  D/c education completed with patient/family including follow up instructions, medication list, d/c activities limitations if indicated, with other d/c instructions as indicated by MD - patient able to verbalize understanding, all questions fully answered.   Patient instructed to return to ED, call 911, or call MD for any changes in condition.   Patient escorted via WC, and D/C home via private auto.   Allergies as of 10/29/2016   No Known Allergies     Medication List    STOP taking these medications   OVER THE COUNTER MEDICATION   POTASSIUM PO     TAKE these medications   aspirin 325 MG tablet Take 1 tablet (325 mg total) by mouth daily.   atorvastatin 40 MG tablet Commonly known as:  LIPITOR Take 1 tablet (40 mg total) by mouth daily at 6 PM.   b complex vitamins capsule Take 1 capsule by mouth daily.   glucosamine-chondroitin 500-400 MG tablet Take 1 tablet by mouth daily.   KRILL OIL PO Take 1 capsule by mouth daily.   multivitamin with minerals Tabs tablet Take 1 tablet by mouth daily.   nicotine 21 mg/24hr patch Commonly known as:  NICODERM CQ - dosed in mg/24 hours Place 1 patch (21 mg total) onto the skin daily.   PROBIOTIC PO Take 1 capsule by mouth daily.   ranitidine 150 MG tablet Commonly known as:  ZANTAC Take 1 tablet (150 mg total) by mouth at bedtime.   VITA-C PO Take 1 tablet by mouth daily.   VITAMIN D3 PO Take 1 capsule by mouth daily.            Durable Medical Equipment        Start     Ordered   10/29/16 1129  For  home use only DME Walker rolling  Once    Question:  Patient needs a walker to treat with the following condition  Answer:  CVA (cerebral vascular accident) (HCC)   10/29/16 1128       Discharge Care Instructions        Start     Ordered   10/29/16 0000  aspirin 325 MG tablet  Daily     10/29/16 1110   10/29/16 0000  atorvastatin (LIPITOR) 40 MG tablet  Daily-1800     10/29/16 1110   10/29/16 0000  Increase activity slowly     10/29/16 1110   10/29/16 0000  Diet - low sodium heart healthy     10/29/16 1110   10/29/16 0000  Discharge instructions    Comments:  Please follow with primary care in 7 days.   10/29/16 1110   10/29/16 0000  Walker rolling     10/29/16 1110   10/29/16 0000  ranitidine (ZANTAC) 150 MG tablet  Daily at bedtime     10/29/16 1110   10/29/16 0000  Ambulatory referral to Neurology    Comments:  An appointment is requested in approximately: 6 weeks.   10/29/16 1110   10/29/16 0000  nicotine (NICODERM CQ - DOSED IN MG/24 HOURS) 21 mg/24hr patch  Every 24 hours  10/29/16 1110   10/29/16 0000  Ambulatory referral to Physical Therapy     10/29/16 1056   10/29/16 0000  Ambulatory referral to Occupational Therapy     10/29/16 1056     Tiphany Fayson K Persais Ethridge 10/29/2016 1:19 PM

## 2016-10-29 NOTE — Care Management Note (Deleted)
Case Management Note  Patient Details  Name: Alexandra Church MRN: [id number] Date of Birth: 12/17/1970  Subjective/Objective:                    Action/Plan:   Expected Discharge Date:  10/29/16               Expected Discharge Plan:  Home/Self Care  In-House Referral:     Discharge planning Services  CM Consult, [facility], Follow-up appt scheduled  Post Acute Care Choice:    Choice offered to:  Patient  DME Arranged:  Dan HumphreysWalker rolling DME Agency:  [facility]  [facility] Agency:     Status of Service:  Completed, signed off  If discussed at Long Length of Stay Meetings, dates discussed:    Additional Comments:  Epifanio LeschesCole, Keaton Stirewalt Hudson, RN 10/29/2016, 11:29 AM

## 2016-10-29 NOTE — Discharge Summary (Signed)
Physician Discharge Summary  Alexandra Church WUJ:[id number] DOB: 05/29/1970 DOA: 10/27/2016  PCP: Patient, No Pcp Per  Admit date: 10/27/2016 Discharge date: 10/29/2016  Admitted From: Home Disposition:  Home  Recommendations for Outpatient Follow-up:  1. Follow up with PCP in 1- week 2. Patient started on aspirin full dose and high potency statin therapy 3. [facility], Darrol Angelarolyn Martin NP 4. Smoking cessation, nicotine patch was prescribed  Home Health: Yes Equipment/Devices: Walker   Discharge Condition: Stable CODE STATUS: Full  Diet recommendation: Cardiac prudent  Brief/Interim Summary: 46 year old female who presented with acute right-sided weakness. Patient is known to have a significant history of tobacco abuse, she developed acute onset of right-sided weakness, associated with paresthesias. After 12 hours of persistent symptoms, she presented to hospital for further evaluation. On initial physical examination, blood pressure 152/91, heart rate 77, temperature 98.5, respiratory rate 18, oxygen saturation 98%. Moist mucous membranes, lungs were clear to auscultation bilaterally, no wheezing, rales or rhonchi, heart S1-S2 present rhythmic, no gallops, rubs or murmurs, abdomen was soft nontender, no lower extremity edema. Sodium 134, potassium 3.5, chloride 103, bicarbonate 22, glucose 89, BUN 8, creatinine 0.97, white count 7.2, hemoglobin 13.6, hematocrit 41.5, platelets 324. Head CT with no acute intracranial abnormality. Brain MRI with left thalamic lacunar infarct. EKG was normal sinus rhythm, normal intervals, normal axis. Urine drug screen negative  Patient admitted to the hospital with acute ischemic infarct, acute left thalamic.  1. Acute left thalamic ischemic cerebrovascular accident. Patient was admitted to the medical unit, she was placed on remote telemetry monitor, frequent neuro checks. Antiplatelet therapy with full dose  aspirin, statin therapy with atorvastatin further workup with carotid ultrasound with no significant stenosis, echocardiogram with normal systolic LV function, increased wall thickness in a pattern of mild LVH, CT angiography of the head and neck with no significant stenosis. Neurology was consulted. Physical therapy evaluation recommended outpatient PT. Rolling walker was prescribed. Patient follow-up with you for [facility] in 6 weeks.   2. Hypertension. Blood pressure remained well-controlled, patient is off antihypertensive agents, she has been instructed to follow blood pressure as an outpatient with a home monitor.   3. Tobacco abuse. Smoking cessation, nicotine patch has been prescribed.  Discharge Diagnoses:  Principal Problem:   Acute thalamic infarction [facility](HCC) Active Problems:   Right sided weakness   HTN (hypertension)   Obesity (BMI 30-39.9)   Tobacco use disorder   Gait disturbance, post-stroke   CVA (cerebral vascular accident) [facility](HCC)    Discharge Instructions   Allergies as of 10/29/2016   No Known Allergies     Medication List    STOP taking these medications   OVER THE COUNTER MEDICATION   POTASSIUM PO     TAKE these medications   aspirin 325 MG tablet Take 1 tablet (325 mg total) by mouth daily.   atorvastatin 40 MG tablet Commonly known as:  LIPITOR Take 1 tablet (40 mg total) by mouth daily at 6 PM.   b complex vitamins capsule Take 1 capsule by mouth daily.   glucosamine-chondroitin 500-400 MG tablet Take 1 tablet by mouth daily.   KRILL OIL PO Take 1 capsule by mouth daily.   multivitamin with minerals Tabs tablet Take 1 tablet by mouth daily.   nicotine 21 mg/24hr patch Commonly known as:  NICODERM CQ - dosed in mg/24 hours Place 1 patch (21 mg total) onto the skin daily.   PROBIOTIC PO Take 1 capsule by mouth  daily.   ranitidine 150 MG tablet Commonly known as:  ZANTAC Take 1 tablet (150 mg total) by mouth at bedtime.    VITA-C PO Take 1 tablet by mouth daily.   VITAMIN D3 PO Take 1 capsule by mouth daily.            Discharge Care Instructions        Start     Ordered   10/29/16 0000  aspirin 325 MG tablet  Daily     10/29/16 1110   10/29/16 0000  atorvastatin (LIPITOR) 40 MG tablet  Daily-1800     10/29/16 1110   10/29/16 0000  Increase activity slowly     10/29/16 1110   10/29/16 0000  Diet - low sodium heart healthy     10/29/16 1110   10/29/16 0000  Discharge instructions    Comments:  Please follow with primary care in 7 days.   10/29/16 1110   10/29/16 0000  Walker rolling     10/29/16 1110   10/29/16 0000  ranitidine (ZANTAC) 150 MG tablet  Daily at bedtime     10/29/16 1110   10/29/16 0000  Ambulatory referral to Neurology    Comments:  An appointment is requested in approximately: 6 weeks.   10/29/16 1110   10/29/16 0000  nicotine (NICODERM CQ - DOSED IN MG/24 HOURS) 21 mg/24hr patch  Every 24 hours     10/29/16 1110   10/29/16 0000  Ambulatory referral to Physical Therapy     10/29/16 1056   10/29/16 0000  Ambulatory referral to Occupational Therapy     10/29/16 1056      No Known Allergies  Consultations:  Neurology    Procedures/Studies: Ct Angio Head W Or Wo Contrast  Result Date: 10/27/2016 CLINICAL DATA:  46 year old female with acute lacunar infarct in the lateral left thalamus on brain MRI today performed for right side numbness and weakness. EXAM: CT ANGIOGRAPHY HEAD AND NECK TECHNIQUE: Multidetector CT imaging of the head and neck was performed using the standard protocol during bolus administration of intravenous contrast. Multiplanar CT image reconstructions and MIPs were obtained to evaluate the vascular anatomy. Carotid stenosis measurements (when applicable) are obtained utilizing NASCET criteria, using the distal internal carotid diameter as the denominator. CONTRAST:  50 mL Isovue 370 COMPARISON:  Brain MRI 0459 hours today.  Head CT 10/26/2016.  FINDINGS: CTA NECK Skeleton: Negative. Upper chest: Negative visualized mediastinum.  Negative lung apices. Other neck: Negative.  No cervical lymphadenopathy. Aortic arch: 3 vessel arch configuration. No arch atherosclerosis or great vessel origin stenosis. Right carotid system: Negative right CCA. Mild soft and calcified plaque at the posterior right ICA origin and bulb with no stenosis. Left carotid system: Negative. Vertebral arteries: No proximal right subclavian artery plaque or stenosis. Normal right vertebral artery origin. No right vertebral artery stenosis to the skullbase. No proximal left subclavian artery plaque or stenosis. Normal left vertebral artery origin. No left vertebral artery stenosis to the skullbase. CTA HEAD Posterior circulation: Codominant distal vertebral arteries without stenosis. Normal left PICA origin. The right AICA appears dominant. No basilar artery stenosis. SCA and PCA origins are patent but somewhat diminutive. Both posterior communicating arteries are present. Bilateral PCA branches are within normal limits. Anterior circulation: There is minimal bilateral ICA siphon calcified plaque without stenosis. The ophthalmic and posterior communicating artery origins are normal. Patent carotid termini. Normal MCA and ACA origins. Anterior communicating artery and bilateral ACA branches are within normal limits. Left MCA M1  segment, bifurcation, and left MCA branches are within normal limits. Right MCA M1 segment, bifurcation, and right MCA branches are within normal limits. Venous sinuses: Patent on delayed images. Anatomic variants: Somewhat diminutive vertebrobasilar system, at least in part related to bilateral posterior communicating arteries contributing to the PCAs. Delayed phase: The left thalamic lacunar infarct remains occult by CT. Gray-white matter differentiation remains normal. No intracranial hemorrhage or mass effect. No abnormal enhancement identified. Review of the MIP  images confirms the above findings IMPRESSION: 1. No emergent large vessel occlusion. 2. No posterior circulation atherosclerosis or stenosis identified. Somewhat diminutive vertebrobasilar system appears to be related to bilateral posterior communicating arteries contributing to the PCAs. 3. Mild right carotid bifurcation and minimal ICA siphon plaque without stenosis. 4. CT appearance of the brain remains normal. 5. No acute findings in the neck. Electronically Signed   By: Odessa Fleming M.D.   On: 10/27/2016 11:26   Ct Head Wo Contrast  Result Date: 10/26/2016 CLINICAL DATA:  Right arm and leg weakness since this evening. Paresthesias. EXAM: CT HEAD WITHOUT CONTRAST TECHNIQUE: Contiguous axial images were obtained from the base of the skull through the vertex without intravenous contrast. COMPARISON:  None. FINDINGS: Brain: No evidence of acute infarction, hemorrhage, hydrocephalus, extra-axial collection or mass lesion/mass effect. Vascular: No hyperdense vessel or unexpected calcification. Slight atherosclerosis of the carotid siphons bilaterally. Skull: Normal. Negative for fracture or focal lesion. Sinuses/Orbits: No acute finding. Other: None. IMPRESSION: No acute intracranial abnormality. Electronically Signed   By: Tollie Eth M.D.   On: 10/26/2016 23:22   Ct Angio Neck W Or Wo Contrast  Result Date: 10/27/2016 CLINICAL DATA:  46 year old female with acute lacunar infarct in the lateral left thalamus on brain MRI today performed for right side numbness and weakness. EXAM: CT ANGIOGRAPHY HEAD AND NECK TECHNIQUE: Multidetector CT imaging of the head and neck was performed using the standard protocol during bolus administration of intravenous contrast. Multiplanar CT image reconstructions and MIPs were obtained to evaluate the vascular anatomy. Carotid stenosis measurements (when applicable) are obtained utilizing NASCET criteria, using the distal internal carotid diameter as the denominator. CONTRAST:  50  mL Isovue 370 COMPARISON:  Brain MRI 0459 hours today.  Head CT 10/26/2016. FINDINGS: CTA NECK Skeleton: Negative. Upper chest: Negative visualized mediastinum.  Negative lung apices. Other neck: Negative.  No cervical lymphadenopathy. Aortic arch: 3 vessel arch configuration. No arch atherosclerosis or great vessel origin stenosis. Right carotid system: Negative right CCA. Mild soft and calcified plaque at the posterior right ICA origin and bulb with no stenosis. Left carotid system: Negative. Vertebral arteries: No proximal right subclavian artery plaque or stenosis. Normal right vertebral artery origin. No right vertebral artery stenosis to the skullbase. No proximal left subclavian artery plaque or stenosis. Normal left vertebral artery origin. No left vertebral artery stenosis to the skullbase. CTA HEAD Posterior circulation: Codominant distal vertebral arteries without stenosis. Normal left PICA origin. The right AICA appears dominant. No basilar artery stenosis. SCA and PCA origins are patent but somewhat diminutive. Both posterior communicating arteries are present. Bilateral PCA branches are within normal limits. Anterior circulation: There is minimal bilateral ICA siphon calcified plaque without stenosis. The ophthalmic and posterior communicating artery origins are normal. Patent carotid termini. Normal MCA and ACA origins. Anterior communicating artery and bilateral ACA branches are within normal limits. Left MCA M1 segment, bifurcation, and left MCA branches are within normal limits. Right MCA M1 segment, bifurcation, and right MCA branches are within normal limits. Venous  sinuses: Patent on delayed images. Anatomic variants: Somewhat diminutive vertebrobasilar system, at least in part related to bilateral posterior communicating arteries contributing to the PCAs. Delayed phase: The left thalamic lacunar infarct remains occult by CT. Gray-white matter differentiation remains normal. No intracranial  hemorrhage or mass effect. No abnormal enhancement identified. Review of the MIP images confirms the above findings IMPRESSION: 1. No emergent large vessel occlusion. 2. No posterior circulation atherosclerosis or stenosis identified. Somewhat diminutive vertebrobasilar system appears to be related to bilateral posterior communicating arteries contributing to the PCAs. 3. Mild right carotid bifurcation and minimal ICA siphon plaque without stenosis. 4. CT appearance of the brain remains normal. 5. No acute findings in the neck. Electronically Signed   By: Odessa Fleming M.D.   On: 10/27/2016 11:26   Mr Brain Wo Contrast  Result Date: 10/27/2016 CLINICAL DATA:  RIGHT-sided numbness and weakness. History of hypertension and obesity. EXAM: MRI HEAD WITHOUT CONTRAST TECHNIQUE: Multiplanar, multiecho pulse sequences of the brain and surrounding structures were obtained without intravenous contrast. COMPARISON:  CT HEAD October 26, 2016 FINDINGS: BRAIN: Subcentimeter reduced diffusion LEFT thalamus with low ADC values and faint FLAIR T2 hyperintense signal. No susceptibility artifact to suggest hemorrhage. The ventricles and sulci are normal for patient's age. Scattered subcentimeter supratentorial white matter FLAIR T2 hyperintensities. No suspicious parenchymal signal, mass or mass effect. No abnormal extra-axial fluid collections. VASCULAR: Normal major intracranial vascular flow voids present at skull base. SKULL AND UPPER CERVICAL SPINE: No abnormal sellar expansion. No suspicious calvarial bone marrow signal. Craniocervical junction maintained. SINUSES/ORBITS: The mastoid air-cells and included paranasal sinuses are well-aerated. The included ocular globes and orbital contents are non-suspicious. OTHER: None. IMPRESSION: 1. Acute LEFT thalamus lacunar infarct. 2. Mild white matter changes compatible with chronic small vessel ischemic disease, atypical distribution for demyelination. Electronically Signed   By:  Awilda Metro M.D.   On: 10/27/2016 05:34       Subjective: Patient feeling well, persistent neuro deficit on the left, no chest pain or dyspnea.   Discharge Exam: Vitals:   10/28/16 2142 10/29/16 0621  BP: 137/80 124/83  Pulse: 71 71  Resp: 18 18  Temp: 98.4 F (36.9 C) 97.7 F (36.5 C)  SpO2: 100% 100%   Vitals:   10/28/16 0620 10/28/16 1351 10/28/16 2142 10/29/16 0621  BP: 116/75 126/85 137/80 124/83  Pulse: 68 73 71 71  Resp: Temp: 98.1 F (36.7 C) 98.2 F (36.8 C) 98.4 F (36.9 C) 97.7 F (36.5 C)  TempSrc: Oral Oral Oral Oral  SpO2: 99% 99% 100% 100%  Weight: 99.4 kg (219 lb 3.2 oz)     Height:        General: Pt is alert, awake, not in acute distress E ENT. No pallor or icterus, oral mucosa moist. Cardiovascular: RRR, S1/S2 +, no rubs, no gallops Respiratory: CTA bilaterally, no wheezing, no rhonchi Abdominal: Soft, NT, ND, bowel sounds + Extremities: no edema, no cyanosis Neurology. Left sided weakness.     The results of significant diagnostics from this hospitalization (including imaging, microbiology, ancillary and laboratory) are listed below for reference.     Microbiology: No results found for this or any previous visit (from the past 240 hour(s)).   Labs: BNP (last 3 results) No results for input(s): BNP in the last 8760 hours. Basic Metabolic Panel:  Recent Labs Lab 10/26/16 2225 10/26/16 2309  NA 134* 138  K 3.5 4.2  CL 103 100*  CO2 22  --  GLUCOSE 89 90  BUN 8 10  CREATININE 0.97 0.90  CALCIUM 9.1  --    Liver Function Tests:  Recent Labs Lab 10/26/16 2225  AST 19  ALT 19  ALKPHOS 63  BILITOT 0.4  PROT 7.3  ALBUMIN 4.2   No results for input(s): LIPASE, AMYLASE in the last 168 hours. No results for input(s): AMMONIA in the last 168 hours. CBC:  Recent Labs Lab 10/26/16 2225 10/26/16 2309  WBC 7.2  --   NEUTROABS 3.7  --   HGB 13.6 15.6*  HCT 41.5 46.0  MCV 76.9*  --   PLT 324  --     Cardiac Enzymes: No results for input(s): CKTOTAL, CKMB, CKMBINDEX, TROPONINI in the last 168 hours. BNP: Invalid input(s): POCBNP CBG:  Recent Labs Lab 10/26/16 2221  GLUCAP 105*   D-Dimer No results for input(s): DDIMER in the last 72 hours. Hgb A1c  Recent Labs  10/28/16 0600  HGBA1C 6.3*   Lipid Profile  Recent Labs  10/28/16 0600  CHOL 187  HDL 45  LDLCALC 117*  TRIG 125  CHOLHDL 4.2   Thyroid function studies  Recent Labs  10/27/16 0821  TSH 1.652   Anemia work up No results for input(s): VITAMINB12, FOLATE, FERRITIN, TIBC, IRON, RETICCTPCT in the last 72 hours. Urinalysis No results found for: COLORURINE, APPEARANCEUR, LABSPEC, PHURINE, GLUCOSEU, HGBUR, BILIRUBINUR, KETONESUR, PROTEINUR, UROBILINOGEN, NITRITE, LEUKOCYTESUR Sepsis Labs Invalid input(s): PROCALCITONIN,  WBC,  LACTICIDVEN Microbiology No results found for this or any previous visit (from the past 240 hour(s)).   Time coordinating discharge: 45 minutes  SIGNED:   Coralie Keens, MD  [facility] 10/29/2016, 8:34 AM Pager [phone]  If 7PM-7AM, please contact night-coverage [email or web address] Password [email or web address]

## 2016-11-04 ENCOUNTER — Ambulatory Visit: Payer: Self-pay | Admitting: Physical Therapy

## 2016-11-09 ENCOUNTER — Encounter: Payer: Self-pay | Admitting: Occupational Therapy

## 2016-11-20 ENCOUNTER — Ambulatory Visit: Payer: Self-pay | Admitting: Family Medicine

## 2016-11-25 ENCOUNTER — Ambulatory Visit (INDEPENDENT_AMBULATORY_CARE_PROVIDER_SITE_OTHER): Payer: Self-pay | Admitting: Family Medicine

## 2016-11-25 ENCOUNTER — Encounter: Payer: Self-pay | Admitting: Family Medicine

## 2016-11-25 VITALS — BP 142/88 | HR 71 | Temp 98.7°F | Resp 16 | Ht 64.0 in | Wt 230.0 lb

## 2016-11-25 DIAGNOSIS — Z87891 Personal history of nicotine dependence: Secondary | ICD-10-CM

## 2016-11-25 DIAGNOSIS — Z2821 Immunization not carried out because of patient refusal: Secondary | ICD-10-CM

## 2016-11-25 DIAGNOSIS — I10 Essential (primary) hypertension: Secondary | ICD-10-CM

## 2016-11-25 DIAGNOSIS — R7303 Prediabetes: Secondary | ICD-10-CM

## 2016-11-25 DIAGNOSIS — Z8673 Personal history of transient ischemic attack (TIA), and cerebral infarction without residual deficits: Secondary | ICD-10-CM

## 2016-11-25 DIAGNOSIS — E669 Obesity, unspecified: Secondary | ICD-10-CM

## 2016-11-25 LAB — POCT URINALYSIS DIP (DEVICE)
BILIRUBIN URINE: NEGATIVE
GLUCOSE, UA: NEGATIVE mg/dL
HGB URINE DIPSTICK: NEGATIVE
Ketones, ur: NEGATIVE mg/dL
LEUKOCYTES UA: NEGATIVE
Nitrite: NEGATIVE
PROTEIN: NEGATIVE mg/dL
Specific Gravity, Urine: <=1.005 (ref 1.005–1.030)
Urobilinogen, UA: 0.2 mg/dL (ref 0.0–1.0)
pH: 5.5 (ref 5.0–8.0)

## 2016-11-25 MED ORDER — AMLODIPINE BESYLATE 2.5 MG PO TABS: 2.5000 mg | ORAL_TABLET | Freq: Every day | ORAL | 5 refills | Status: AC

## 2016-11-25 MED ORDER — ASPIRIN 325 MG PO TABS: 325.0000 mg | ORAL_TABLET | Freq: Every day | ORAL | 5 refills | Status: AC

## 2016-11-25 MED ORDER — ATORVASTATIN CALCIUM 40 MG PO TABS: 40.0000 mg | ORAL_TABLET | Freq: Every day | ORAL | 5 refills | Status: AC

## 2016-11-25 NOTE — Patient Instructions (Addendum)
Recommend a lowfat, low carbohydrate diet divided over 5-6 small meals, increase water intake to 6-8 glasses, and 150 minutes per week of cardiovascular exercise.  Cholesterol Cholesterol is a fat. Your body needs a small amount of cholesterol. Cholesterol (plaque) may build up in your blood vessels (arteries). That makes you more likely to have a heart attack or stroke. You cannot feel your cholesterol level. Having a blood test is the only way to find out if your level is high. Keep your test results. Work with your doctor to keep your cholesterol at a good level. What do the results mean?  Total cholesterol is how much cholesterol is in your blood.  LDL is bad cholesterol. This is the type that can build up. Try to have low LDL.  HDL is good cholesterol. It cleans your blood vessels and carries LDL away. Try to have high HDL.  Triglycerides are fat that the body can store or burn for energy. What are good levels of cholesterol?  Total cholesterol below 200.  LDL below 100 is good for people who have health risks. LDL below 70 is good for people who have very high risks.  HDL above 40 is good. It is best to have HDL of 60 or higher.  Triglycerides below 150. How can I lower my cholesterol? Diet Follow your diet program as told by your doctor.  Choose fish, white meat chicken, or [place] that is roasted or baked. Try not to eat red meat, fried foods, sausage, or lunch meats.  Eat lots of fresh fruits and vegetables.  Choose whole grains, beans, pasta, potatoes, and cereals.  Choose olive oil, corn oil, or canola oil. Only use small amounts.  Try not to eat butter, mayonnaise, shortening, or palm kernel oils.  Try not to eat foods with trans fats.  Choose low-fat or nonfat dairy foods. ? Drink skim or nonfat milk. ? Eat low-fat or nonfat yogurt and cheeses. ? Try not to drink whole milk or cream. ? Try not to eat ice cream, egg yolks, or full-fat cheeses.  Healthy desserts  include angel food cake, ginger snaps, animal crackers, hard candy, popsicles, and low-fat or nonfat frozen yogurt. Try not to eat pastries, cakes, pies, and cookies.  Exercise Follow your exercise program as told by your doctor.  Be more active. Try gardening, walking, and taking the stairs.  Ask your doctor about ways that you can be more active.  Medicine  Take over-the-counter and prescription medicines only as told by your doctor.  This information is not intended to replace advice given to you by your health care provider. Make sure you discuss any questions you have with your health care provider. Document Released: 05/01/2008 Document Revised: 09/04/2015 Document Reviewed: 08/15/2015 Elsevier Interactive Patient Education  2017 Else[employer] Inc.  Tobacco Use Disorder Tobacco use disorder (TUD) is a mental disorder. It is the long-term use of tobacco in spite of related health problems or difficulty with normal life activities. Tobacco is most commonly smoked as cigarettes and less commonly as cigars or pipes. Smokeless chewing tobacco and snuff are also popular. People with TUD get a feeling of extreme pleasure (euphoria) from using tobacco and have a desire to use it again and again. Repeated use of tobacco can cause problems. The addictive effects of tobacco are due mainly tothe ingredient nicotine. Nicotine also causes a rush of adrenaline (epinephrine) in the body. This leads to increased blood pressure, heart rate, and breathing rate. These changes may cause problems  for people with high blood pressure, weak hearts, or lung disease. High doses of nicotine in children and pets can lead to seizures and death. Tobacco contains a number of other unsafe chemicals. These chemicals are especially harmful when inhaled as smoke and can damage almost every organ in the body. Smokers live shorter lives than nonsmokers and are at risk of dying from a number of diseases and cancers. Tobacco smoke  can also cause health problems for nonsmokers (due to inhaling secondhand smoke). Smoking is also a fire hazard. TUD usually starts in the late teenage years and is most common in young adults between the ages of 58 and 25 years. People who start smoking earlier in life are more likely to continue smoking as adults. TUD is somewhat more common in men than women. People with TUD are at higher risk for using alcohol and other drugs of abuse. What increases the risk? Risk factors for TUD include:  Having family members with the disorder.  Being around people who use tobacco.  Having an existing mental health issue such as schizophrenia, depression, bipolar disorder, ADHD, or posttraumatic stress disorder (PTSD).  What are the signs or symptoms? People with tobacco use disorder have two or more of the following signs and symptoms within 12 months:  Use of more tobacco over a longer period than intended.  Not able to cut down or control tobacco use.  A lot of time spent obtaining or using tobacco.  Strong desire or urge to use tobacco (craving). Cravings may last for 6 months or longer after quitting.  Use of tobacco even when use leads to major problems at work, school, or home.  Use of tobacco even when use leads to relationship problems.  Giving up or cutting down on important life activities because of tobacco use.  Repeatedly using tobacco in situations where it puts you or others in physical danger, like smoking in bed.  Use of tobacco even when it is known that a physical or mental problem is likely related to tobacco use. ? Physical problems are numerous and may include chronic bronchitis, emphysema, lung and other cancers, gum disease, high blood pressure, heart disease, and stroke. ? Mental problems caused by tobacco may include difficulty sleeping and anxiety.  Need to use greater amounts of tobacco to get the same effect. This means you have developed a  tolerance.  Withdrawal symptoms as a result of stopping or rapidly cutting back use. These symptoms may last a month or more after quitting and include the following: ? Depressed, anxious, or irritable mood. ? Difficulty concentrating. ? Increased appetite. ? Restlessness or trouble sleeping. ? Use of tobacco to avoid withdrawal symptoms.  How is this diagnosed? Tobacco use disorder is diagnosed by your health care provider. A diagnosis may be made by:  Your health care provider asking questions about your tobacco use and any problems it may be causing.  A physical exam.  Lab tests.  You may be referred to a mental health professional or addiction specialist.  The severity of tobacco use disorder depends on the number of signs and symptoms you have:  Mild-Two or three symptoms.  Moderate-Four or five symptoms.  Severe-Six or more symptoms.  How is this treated? Many people with tobacco use disorder are unable to quit on their own and need help. Treatment options include the following:  Nicotine replacement therapy (NRT). NRT provides nicotine without the other harmful chemicals in tobacco. NRT gradually lowers the dosage of nicotine in  the body and reduces withdrawal symptoms. NRT is available in over-the-counter forms (gum, lozenges, and skin patches) as well as prescription forms (mouth inhaler and nasal spray).  Medicines.This may include: ? Antidepressant medicine that may reduce nicotine cravings. ? A medicine that acts on nicotine receptors in the brain to reduce cravings and withdrawal symptoms. It may also block the effects of tobacco in people with TUD who relapse.  Counseling or talk therapy. A form of talk therapy called behavioral therapy is commonly used to treat people with TUD. Behavioral therapy looks at triggers for tobacco use, how to avoid them, and how to cope with cravings. It is most effective in person or by phone but is also available in self-help forms  (books and Internet websites).  Support groups. These provide emotional support, advice, and guidance for quitting tobacco.  The most effective treatment for TUD is usually a combination of medicine, talk therapy, and support groups. Follow these instructions at home:  Keep all follow-up visits as directed by your health care provider. This is important.  Take medicines only as directed by your health care provider.  Check with your health care provider before starting new prescription or over-the-counter medicines. Contact a health care provider if:  You are not able to take your medicines as prescribed.  Treatment is not helping your TUD and your symptoms get worse. Get help right away if:  You have serious thoughts about hurting yourself or others.  You have trouble breathing, chest pain, sudden weakness, or sudden numbness in part of your body. This information is not intended to replace advice given to you by your health care provider. Make sure you discuss any questions you have with your health care provider. Document Released: 10/09/2003 Document Revised: 10/06/2015 Document Reviewed: 03/31/2013 Elsevier Interactive Patient Education  [employer].

## 2016-11-25 NOTE — Progress Notes (Signed)
Subjective:    Patient ID: Alexandra Church, female    DOB: 05-Mar-1970, 46 y.o.   MRN: [id number]  HPI  Alexandra Church, a 46 year old female presents to establish care. Patient mainly uses emergency room and urgent care for all primary care needs. Ms. Dombrosky was recently admitted to inpatient services for a CVA. Patient says that she had a sudden onset of dizziness and tingling to right arm. CT of head was negative, but brain MRI showed an acute lacunar infarct in the thalamic region. Patient had mild right side deficits. She denies facial paralysis, paresthesias, chest pain, syncope, nausea, vomiting, or diarrhea. She was discharged on ASA and statin therapy. She is a former smoker, she has not smoked cigarettes since hospital discharge.    Past Medical History:  Diagnosis Date  . Hypertension   . Known health problems: none    Social History   Social History  . Marital status: Single    Spouse name: N/A  . Number of children: N/A  . Years of education: N/A   Occupational History  . Not on file.   Social History Main Topics  . Smoking status: Former Smoker    Packs/day: 1.00    Types: Cigarettes  . Smokeless tobacco: Never Used  . Alcohol use Yes     Comment: socially- not daily  . Drug use: No  . Sexual activity: No   Other Topics Concern  . Not on file   Social History Narrative  . No narrative on file   Review of Systems  Constitutional: Negative.  Negative for fatigue and unexpected weight change (Weight gain).  HENT: Negative.   Eyes: Negative.   Respiratory: Negative.   Cardiovascular: Negative.   Gastrointestinal: Negative.   Endocrine: Negative.  Negative for polydipsia, polyphagia and polyuria.  Genitourinary: Negative.   Musculoskeletal: Negative.   Allergic/Immunologic: Negative.   Neurological: Negative.   Hematological: Negative.   Psychiatric/Behavioral: Negative.        Objective:   Physical Exam  Constitutional: She is oriented to person, place, and  time. She appears well-developed and well-nourished.  HENT:  Head: Normocephalic and atraumatic.  Right Ear: External ear normal.  Left Ear: External ear normal.  Nose: Nose normal.  Mouth/Throat: Oropharynx is clear and moist.  Eyes: Pupils are equal, round, and reactive to light. Conjunctivae and EOM are normal.  Neck: Normal range of motion. Neck supple.  Cardiovascular: Normal rate, regular rhythm, normal heart sounds and intact distal pulses.   Pulmonary/Chest: Effort normal and breath sounds normal.  Abdominal: Soft. Bowel sounds are normal.  Musculoskeletal: Normal range of motion.  Neurological: She is alert and oriented to person, place, and time. She has normal reflexes.  Skin: Skin is warm and dry.  Psychiatric: She has a normal mood and affect. Her behavior is normal. Judgment and thought content normal.      BP 140/90 (BP Location: Left Arm, Patient Position: Sitting, Cuff Size: Large) Comment: manually  Pulse 71   Temp 98.7 F (37.1 C) (Oral)   Resp 16   Ht  (1.626 m)   Wt 230 lb (104.3 kg)   LMP 11/01/2016   SpO2 99%   BMI 39.48 kg/m  Assessment & Plan:  1. Essential hypertension Blood pressure is above goal. Will start a trial of a CCB.  No proteinuria present Reviewed previous labs, renal functioning within normal range - amLODipine (NORVASC) 2.5 MG tablet; Take 1 tablet (2.5 mg total) by mouth daily.  Dispense:  30 tablet; Refill: 5 - aspirin 325 MG tablet; Take 1 tablet (325 mg total) by mouth daily.  Dispense: 30 tablet; Refill: 5 - atorvastatin (LIPITOR) 40 MG tablet; Take 1 tablet (40 mg total) by mouth daily at 6 PM.  Dispense: 30 tablet; Refill: 5  2. History of CVA (cerebrovascular accident) Will continue statin and ASA therapy.  Scheduled to follow up with Darrol Angel, NP in 6 weeks. - atorvastatin (LIPITOR) 40 MG tablet; Take 1 tablet (40 mg total) by mouth daily at 6 PM.  Dispense: 30 tablet; Refill: 5  3. Former smoker Smoking cessation  instruction/counseling given:  commended patient for quitting and reviewed strategies for preventing relapses  4. Obesity (BMI 30-39.9) Recommend a lowfat, low carbohydrate diet divided over 5-6 small meals, increase water intake to 6-8 glasses, and 150 minutes per week of cardiovascular exercise.   Given information on 1800 low calorie diet, discussed at length  5. Prediabetes Previously hemoglobin a1C 6.3. Discussed carbohydrate modified, 1800 calorie diet at length.    RTC: 1 week for bp check, 1 month for hypertension  Health maintenance:  Recommend pap smear Refused all vaccinations     Nolon Nations  MSN, FNP-C [facility] [address] [place], [place] [address] [phone]

## 2016-12-03 ENCOUNTER — Ambulatory Visit: Payer: Self-pay

## 2016-12-03 VITALS — BP 162/82

## 2016-12-03 DIAGNOSIS — I10 Essential (primary) hypertension: Secondary | ICD-10-CM

## 2016-12-14 NOTE — Progress Notes (Signed)
[facility]  PATIENT: Alexandra Church DOB: 1970/11/03   REASON FOR VISIT:  Acute left thalamus lacunar infarct  Secondary to small vessel disease HISTORY FROM: patient and Mom    HISTORY OF PRESENT ILLNESS:HISTORY OF PRESENT ILLNESS (per record) Alexandra Church a right-handed 16 y.oAfrican-American  female with medical history significant for tobacco use (current smoker) who presented to [facility] with right-sided weakness.  LKW at 8:30 PM on 10/26/2016 while she was to texting on her phone, when she noticed that her right hand felt heavy. She tried to get up from bed and noticed she was dragging her right leg. She also felt slightly confused. She called EMS who brought her to the hospital.  She apparently had hypertension in her 44s and was briefly on antihypertensives. This is thought to be due to anxiety, and her blood pressure has been normal since. She currently does not see a physician and is not on any medications. Per mother EMS documented her BP to be 175/106. Date last known well: 9.10.18 Time last known well: 20.30 NIHSS 2 Modified Rankin:0  Patient was not administered IV t-PA secondary to minimal deficits on arrival. She was admitted to General Neurology for further evaluation and treatment. SUBJECTIVE (INTERVAL HISTORY) Her family is at the bedside.  The patient is awake, alert, and follows all commands appropriately.  Very mild RUE pronator drift, and slightly slower RUE fine motor skills.  RLE with 3+ strength. BP on the high side, not taking meds at home. Willing to quit smoking.  Interval history 11/30/2018CM Alexandra Church, 46 year old female returns for hospital follow-up after admission for acute left thalamus lacunar infarct secondary to small vessel disease. CTA head and neck unremarkable. Carotid Doppler 1-39% stenosis. 2-D echo vigorous LV systolic function with mild LVH  mild diastolic dysfunction. LDL 117. Hemoglobin A1c 6.3 no anticoagulant prior to admission.  She is currently on aspirin 325 daily without further stroke or TIA symptoms. She has no bleeding and no bruising. Blood pressure and the office today 152/94. She has recently been placed on amlodipine. She remains on Lipitor for hyperlipidemia without complaints of myalgias. She has stopped smoking. She did not require any outpatient therapies. She returns for reevaluation. She has questions about diet   REVIEW OF SYSTEMS: Full 14 system review of systems performed and notable only for those listed, all others are neg:  Constitutional: neg  Cardiovascular: neg Ear/Nose/Throat: neg  Skin: neg Eyes: neg Respiratory: neg Gastroitestinal: neg  Hematology/Lymphatic: neg  Endocrine: neg Musculoskeletal:neg Allergy/Immunology: neg Neurological: neg Psychiatric: Depression Sleep : neg   ALLERGIES: No Known Allergies  HOME MEDICATIONS: Outpatient Medications Prior to Visit  Medication Sig Dispense Refill  . amLODipine (NORVASC) 2.5 MG tablet Take 1 tablet (2.5 mg total) by mouth daily. 30 tablet 5  . Ascorbic Acid (VITA-C PO) Take 1 tablet by mouth daily.    Marland Kitchen aspirin 325 MG tablet Take 1 tablet (325 mg total) by mouth daily. 30 tablet 5  . atorvastatin (LIPITOR) 40 MG tablet Take 1 tablet (40 mg total) by mouth daily at 6 PM. 30 tablet 5  . b complex vitamins capsule Take 1 capsule by mouth daily.    . Cholecalciferol (VITAMIN D3 PO) Take 1 capsule by mouth daily.    Marland Kitchen glucosamine-chondroitin 500-400 MG tablet Take 1 tablet by mouth daily.    Marland Kitchen KRILL OIL PO Take 1 capsule by mouth daily.    . Multiple Vitamin (MULTIVITAMIN WITH MINERALS) TABS tablet Take 1 tablet by mouth  daily.    . ranitidine (ZANTAC) 150 MG tablet Take 1 tablet (150 mg total) by mouth at bedtime. 30 tablet 0  . nicotine (NICODERM CQ - DOSED IN MG/24 HOURS) 21 mg/24hr patch Place 1 patch (21 mg total) onto the skin daily. (Patient not taking: Reported on 11/25/2016) 30 patch 1  . Probiotic Product (PROBIOTIC PO) Take  1 capsule by mouth daily.     No facility-administered medications prior to visit.     PAST MEDICAL HISTORY: Past Medical History:  Diagnosis Date  . Hypertension   . Known health problems: none     PAST SURGICAL HISTORY: Past Surgical History:  Procedure Laterality Date  . no prior surgery      FAMILY HISTORY: Family History  Problem Relation Age of Onset  . Hypertension Father   . High blood pressure Brother     SOCIAL HISTORY: Social History   Social History  . Marital status: Single    Spouse name: N/A  . Number of children: N/A  . Years of education: N/A   Occupational History  . Not on file.   Social History Main Topics  . Smoking status: Former Smoker    Packs/day: 1.00    Types: Cigarettes  . Smokeless tobacco: Never Used  . Alcohol use No     Comment: socially- not daily  . Drug use: No  . Sexual activity: No   Other Topics Concern  . Not on file   Social History Narrative   Lives alone at home.  Not working at this time.   No children.  Caffeine 1 cup daily.  Education: 2 yr college.      PHYSICAL EXAM  Vitals:   12/15/16 0956  BP: (!) 152/94  Pulse: 83  Weight: 227 lb 3.2 oz (103.1 kg)  Height: 5\' 4"  (1.626 m)   Body mass index is 39 kg/m.  Generalized: Well developed, Obese female in no acute distress  Head: normocephalic and atraumatic,. Oropharynx benign  Neck: Supple, no carotid bruits  Cardiac: Regular rate rhythm, no murmur  Musculoskeletal: No deformity   Neurological examination   Mentation: Alert oriented to time, place, history taking. Attention span and concentration appropriate. Recent and remote memory intact.  Follows all commands speech and language fluent.   Cranial nerve II-XII: Pupils were equal round reactive to light extraocular movements were full, visual field were full on confrontational test. Right nasolabial fold flattening Facial sensation slightly decreased on the right  hearing was intact to finger  rubbing bilaterally. Uvula tongue midline. head turning and shoulder shrug were normal and symmetric.Tongue protrusion into cheek strength was normal. Motor: normal bulk and tone, full strength in the BUE, BLE, fine finger movements normal, no pronator drift. No focal weakness Sensory: normal and symmetric to light touch, pinprick, and  Vibration, in the upper and lower extremities Coordination: finger-nose-finger, heel-to-shin bilaterally, no dysmetria, no tremor Reflexes: 1+ upper lower and symmetric, plantar responses were flexor bilaterally. Gait and Station: Rising up from seated position without assistance, normal stance,  moderate stride, good arm swing, smooth turning, able to perform tiptoe, and heel walking without difficulty. Tandem gait is mildly unsteady. No assistive device  DIAGNOSTIC DATA (LABS, IMAGING, TESTING) - I reviewed patient records, labs, notes, testing and imaging myself where available.  Lab Results  Component Value Date   WBC 7.2 10/26/2016   HGB 15.6 (H) 10/26/2016   HCT 46.0 10/26/2016   MCV 76.9 (L) 10/26/2016   PLT 324 10/26/2016  Component Value Date/Time   NA 138 10/26/2016 2309   K 4.2 10/26/2016 2309   CL 100 (L) 10/26/2016 2309   CO2 22 10/26/2016 2225   GLUCOSE 90 10/26/2016 2309   BUN 10 10/26/2016 2309   CREATININE 0.90 10/26/2016 2309   CALCIUM 9.1 10/26/2016 2225   PROT 7.3 10/26/2016 2225   ALBUMIN 4.2 10/26/2016 2225   AST 19 10/26/2016 2225   ALT 19 10/26/2016 2225   ALKPHOS 63 10/26/2016 2225   BILITOT 0.4 10/26/2016 2225   GFRNONAA >60 10/26/2016 2225   GFRAA >60 10/26/2016 2225   Lab Results  Component Value Date   CHOL 187 10/28/2016   HDL 45 10/28/2016   LDLCALC 117 (H) 10/28/2016   TRIG 125 10/28/2016   CHOLHDL 4.2 10/28/2016   Lab Results  Component Value Date   HGBA1C 6.3 (H) 10/28/2016   No results found for: [id number] Lab Results  Component Value Date   TSH 1.652 10/27/2016      ASSESSMENT AND  PLAN  46 y.o. year old female  here for hospital follow-up  after admission for acute left thalamus lacunar infarct secondary to small vessel disease. CTA head and neck unremarkable. Carotid Doppler 1-39% stenosis. 2-D echo vigorous LV systolic function with mild LVH  mild diastolic dysfunction. LDL 117. Hemoglobin A1c 6.3 no anticoagulant prior to admission. Patient has stopped smoking   PLAN: Stressed the importance of management of risk factors to prevent further stroke Continue  Aspirin for secondary stroke prevention Maintain strict control of hypertension with blood pressure goal below 130/90, today's reading 152/94 continue antihypertensive medications Control of diabetes with hemoglobin A1c below 6.5 followed by primary care most recent hemoglobin A1c6.3 Cholesterol with LDL cholesterol less than 70, followed by primary care,  most recent .117 continue statin drugs Lipitor Exercise by walking, 30 min daily  eat healthy diet with whole grains,  fresh fruits and vegetables Suggest Mediterranean Given written instructions along with review of the diet  follow-up in 6 months Discussed risk for recurrent stroke/ TIA and answered additional questions This was a visit requiring 30 minutes and medical decision making of high complexity with extensive review of history, hospital chart, counseling and answering questions for patient and mother Nilda Riggs, [facility], [facility], APRN  [facility] [address], [address] [place], [place] [address] [phone]

## 2016-12-15 ENCOUNTER — Encounter: Payer: Self-pay | Admitting: Nurse Practitioner

## 2016-12-15 ENCOUNTER — Ambulatory Visit (INDEPENDENT_AMBULATORY_CARE_PROVIDER_SITE_OTHER): Payer: MEDICAID | Admitting: Nurse Practitioner

## 2016-12-15 VITALS — BP 152/94 | HR 83 | Ht 64.0 in | Wt 227.2 lb

## 2016-12-15 DIAGNOSIS — I639 Cerebral infarction, unspecified: Secondary | ICD-10-CM

## 2016-12-15 DIAGNOSIS — I10 Essential (primary) hypertension: Secondary | ICD-10-CM

## 2016-12-15 DIAGNOSIS — E785 Hyperlipidemia, unspecified: Secondary | ICD-10-CM

## 2016-12-15 DIAGNOSIS — I6381 Other cerebral infarction due to occlusion or stenosis of small artery: Secondary | ICD-10-CM

## 2016-12-15 NOTE — Patient Instructions (Addendum)
Stressed the importance of management of risk factors to prevent further stroke Continue  Aspirin for secondary stroke prevention Maintain strict control of hypertension with blood pressure goal below 130/90, today's reading 152/94 continue antihypertensive medications Control of diabetes with hemoglobin A1c below 6.5 followed by primary care most recent hemoglobin A1c6.3 Cholesterol with LDL cholesterol less than 70, followed by primary care,  most recent .117 continue statin drugs Lipitor Exercise by walking, 30 min daily  eat healthy diet with whole grains,  fresh fruits and vegetables Suggest Mediterranean  follow-up in 6 months  Mediterranean Diet A Mediterranean diet refers to food and lifestyle choices that are based on the traditions of countries located on the [place]. This way of eating has been shown to help prevent certain conditions and improve outcomes for people who have chronic diseases, like kidney disease and heart disease. What are tips for following this plan? Lifestyle  Cook and eat meals together with your family, when possible.  Drink enough fluid to keep your urine clear or pale yellow.  Be physically active every day. This includes: ? Aerobic exercise like running or swimming. ? Leisure activities like gardening, walking, or housework.  Get 7-8 hours of sleep each night.  If recommended by your health care provider, drink red wine in moderation. This means 1 glass a day for nonpregnant women and 2 glasses a day for men. A glass of wine equals 5 oz (150 mL). Reading food labels  Check the serving size of packaged foods. For foods such as rice and pasta, the serving size refers to the amount of cooked product, not dry.  Check the total fat in packaged foods. Avoid foods that have saturated fat or trans fats.  Check the ingredients list for added sugars, such as corn syrup. Shopping  At the grocery store, buy most of your food from the areas near the  walls of the store. This includes: ? Fresh fruits and vegetables (produce). ? Grains, beans, nuts, and seeds. Some of these may be available in unpackaged forms or large amounts (in bulk). ? Fresh seafood. ? Poultry and eggs. ? Low-fat dairy products.  Buy whole ingredients instead of prepackaged foods.  Buy fresh fruits and vegetables in-season from local farmers markets.  Buy frozen fruits and vegetables in resealable bags.  If you do not have access to quality fresh seafood, buy precooked frozen shrimp or canned fish, such as tuna, salmon, or sardines.  Buy small amounts of raw or cooked vegetables, salads, or olives from the deli or salad bar at your store.  Stock your pantry so you always have certain foods on hand, such as olive oil, canned tuna, canned tomatoes, rice, pasta, and beans. Cooking  Cook foods with extra-virgin olive oil instead of using butter or other vegetable oils.  Have meat as a side dish, and have vegetables or grains as your main dish. This means having meat in small portions or adding small amounts of meat to foods like pasta or stew.  Use beans or vegetables instead of meat in common dishes like chili or lasagna.  Experiment with different cooking methods. Try roasting or broiling vegetables instead of steaming or sauteing them.  Add frozen vegetables to soups, stews, pasta, or rice.  Add nuts or seeds for added healthy fat at each meal. You can add these to yogurt, salads, or vegetable dishes.  Marinate fish or vegetables using olive oil, lemon juice, garlic, and fresh herbs. Meal planning  Plan to eat 1  vegetarian meal one day each week. Try to work up to 2 vegetarian meals, if possible.  Eat seafood 2 or more times a week.  Have healthy snacks readily available, such as: ? Vegetable sticks with hummus. ? [place] yogurt. ? Fruit and nut trail mix.  Eat balanced meals throughout the week. This includes: ? Fruit: 2-3 servings a  day ? Vegetables: 4-5 servings a day ? Low-fat dairy: 2 servings a day ? Fish, poultry, or lean meat: 1 serving a day ? Beans and legumes: 2 or more servings a week ? Nuts and seeds: 1-2 servings a day ? Whole grains: 6-8 servings a day ? Extra-virgin olive oil: 3-4 servings a day  Limit red meat and sweets to only a few servings a month What are my food choices?  Mediterranean diet ? Recommended ? Grains: Whole-grain pasta. Brown rice. Bulgar wheat. Polenta. Couscous. Whole-wheat bread. Orpah Cobb. ? Vegetables: Artichokes. Beets. Broccoli. Cabbage. Carrots. Eggplant. Green beans. Chard. Kale. Spinach. Onions. Leeks. Peas. Squash. Tomatoes. Peppers. Radishes. ? Fruits: Apples. Apricots. Avocado. Berries. Bananas. Cherries. Dates. Figs. Grapes. Lemons. Melon. Oranges. Peaches. Plums. Pomegranate. ? Meats and other protein foods: Beans. Almonds. Sunflower seeds. Pine nuts. Peanuts. Cod. Salmon. Scallops. Shrimp. Tuna. Tilapia. Clams. Oysters. Eggs. ? Dairy: Low-fat milk. Cheese. Greek yogurt. ? Beverages: Water. Red wine. Herbal tea. ? Fats and oils: Extra virgin olive oil. Avocado oil. Grape seed oil. ? Sweets and desserts: [place] yogurt with honey. Baked apples. Poached pears. Trail mix. ? Seasoning and other foods: Basil. Cilantro. Coriander. Cumin. Mint. Parsley. Sage. Rosemary. Tarragon. Garlic. Oregano. Thyme. Pepper. Balsalmic vinegar. Tahini. Hummus. Tomato sauce. Olives. Mushrooms. ? Limit these ? Grains: Prepackaged pasta or rice dishes. Prepackaged cereal with added sugar. ? Vegetables: Deep fried potatoes (french fries). ? Fruits: Fruit canned in syrup. ? Meats and other protein foods: Beef. Pork. Lamb. Poultry with skin. Hot dogs. Tomasa Blase. ? Dairy: Ice cream. Sour cream. Whole milk. ? Beverages: Juice. Sugar-sweetened soft drinks. Beer. Liquor and spirits. ? Fats and oils: Butter. Canola oil. Vegetable oil. Beef fat (tallow). Lard. ? Sweets and desserts: Cookies. Cakes.  Pies. Candy. ? Seasoning and other foods: Mayonnaise. Premade sauces and marinades. ? The items listed may not be a complete list. Talk with your dietitian about what dietary choices are right for you. Summary  The Mediterranean diet includes both food and lifestyle choices.  Eat a variety of fresh fruits and vegetables, beans, nuts, seeds, and whole grains.  Limit the amount of red meat and sweets that you eat.  Talk with your health care provider about whether it is safe for you to drink red wine in moderation. This means 1 glass a day for nonpregnant women and 2 glasses a day for men. A glass of wine equals 5 oz (150 mL). This information is not intended to replace advice given to you by your health care provider. Make sure you discuss any questions you have with your health care provider. Document Released: 09/26/2015 Document Revised: 10/29/2015 Document Reviewed: 09/26/2015 Elsevier Interactive Patient Education  [employer].

## 2016-12-17 NOTE — Progress Notes (Signed)
I reviewed above note and agree with the assessment and plan.  Marvel PlanJindong Leiana Rund, MD PhD Stroke Neurology 12/17/2016 3:05 PM

## 2016-12-28 ENCOUNTER — Ambulatory Visit: Payer: Self-pay | Admitting: Family Medicine

## 2017-06-15 NOTE — Progress Notes (Deleted)
[facility]  PATIENT: Alexandra Church DOB: 1971/01/09   REASON FOR VISIT:  Acute left thalamus lacunar infarct  Secondary to small vessel disease HISTORY FROM: patient and Mom    HISTORY OF PRESENT ILLNESS:HISTORY OF PRESENT ILLNESS (per record) Alexandra Church a right-handed 47 y.oAfrican-American  female with medical history significant for tobacco use (current smoker) who presented to [facility] with right-sided weakness.  LKW at 8:30 PM on 10/26/2016 while she was to texting on her phone, when she noticed that her right hand felt heavy. She tried to get up from bed and noticed she was dragging her right leg. She also felt slightly confused. She called EMS who brought her to the hospital.  She apparently had hypertension in her 31s and was briefly on antihypertensives. This is thought to be due to anxiety, and her blood pressure has been normal since. She currently does not see a physician and is not on any medications. Per mother EMS documented her BP to be 175/106. Date last known well: 9.10.18 Time last known well: 20.30 NIHSS 2 Modified Rankin:0  Patient was not administered IV t-PA secondary to minimal deficits on arrival. She was admitted to General Neurology for further evaluation and treatment. SUBJECTIVE (INTERVAL HISTORY) Her family is at the bedside.  The patient is awake, alert, and follows all commands appropriately.  Very mild RUE pronator drift, and slightly slower RUE fine motor skills.  RLE with 3+ strength. BP on the high side, not taking meds at home. Willing to quit smoking.  Interval history 11/30/2018CM Ms. Argueta, 47 year old female returns for hospital follow-up after admission for acute left thalamus lacunar infarct secondary to small vessel disease. CTA head and neck unremarkable. Carotid Doppler 1-39% stenosis. 2-D echo vigorous LV systolic function with mild LVH  mild diastolic dysfunction. LDL 117. Hemoglobin A1c 6.3 no anticoagulant prior to admission.  She is currently on aspirin 325 daily without further stroke or TIA symptoms. She has no bleeding and no bruising. Blood pressure and the office today 152/94. She has recently been placed on amlodipine. She remains on Lipitor for hyperlipidemia without complaints of myalgias. She has stopped smoking. She did not require any outpatient therapies. She returns for reevaluation. She has questions about diet   REVIEW OF SYSTEMS: Full 14 system review of systems performed and notable only for those listed, all others are neg:  Constitutional: neg  Cardiovascular: neg Ear/Nose/Throat: neg  Skin: neg Eyes: neg Respiratory: neg Gastroitestinal: neg  Hematology/Lymphatic: neg  Endocrine: neg Musculoskeletal:neg Allergy/Immunology: neg Neurological: neg Psychiatric: Depression Sleep : neg   ALLERGIES: No Known Allergies  HOME MEDICATIONS: Outpatient Medications Prior to Visit  Medication Sig Dispense Refill  . amLODipine (NORVASC) 2.5 MG tablet Take 1 tablet (2.5 mg total) by mouth daily. 30 tablet 5  . Ascorbic Acid (VITA-C PO) Take 1 tablet by mouth daily.    Marland Kitchen atorvastatin (LIPITOR) 40 MG tablet Take 1 tablet (40 mg total) by mouth daily at 6 PM. 30 tablet 5  . b complex vitamins capsule Take 1 capsule by mouth daily.    . Cholecalciferol (VITAMIN D3 PO) Take 1 capsule by mouth daily.    Marland Kitchen glucosamine-chondroitin 500-400 MG tablet Take 1 tablet by mouth daily.    Marland Kitchen KRILL OIL PO Take 1 capsule by mouth daily.    . Multiple Vitamin (MULTIVITAMIN WITH MINERALS) TABS tablet Take 1 tablet by mouth daily.    . ranitidine (ZANTAC) 150 MG tablet Take 1 tablet (150 mg total) by mouth  at bedtime. 30 tablet 0   No facility-administered medications prior to visit.     PAST MEDICAL HISTORY: Past Medical History:  Diagnosis Date  . Hypertension   . Known health problems: none     PAST SURGICAL HISTORY: Past Surgical History:  Procedure Laterality Date  . no prior surgery      FAMILY  HISTORY: Family History  Problem Relation Age of Onset  . Hypertension Father   . High blood pressure Brother     SOCIAL HISTORY: Social History   Socioeconomic History  . Marital status: Single    Spouse name: Not on file  . Number of children: Not on file  . Years of education: Not on file  . Highest education level: Not on file  Occupational History  . Not on file  Social Needs  . Financial resource strain: Not on file  . Food insecurity:    Worry: Not on file    Inability: Not on file  . Transportation needs:    Medical: Not on file    Non-medical: Not on file  Tobacco Use  . Smoking status: Former Smoker    Packs/day: 1.00    Types: Cigarettes  . Smokeless tobacco: Never Used  Substance and Sexual Activity  . Alcohol use: No    Comment: socially- not daily  . Drug use: No  . Sexual activity: Never  Lifestyle  . Physical activity:    Days per week: Not on file    Minutes per session: Not on file  . Stress: Not on file  Relationships  . Social connections:    Talks on phone: Not on file    Gets together: Not on file    Attends religious service: Not on file    Active member of club or organization: Not on file    Attends meetings of clubs or organizations: Not on file    Relationship status: Not on file  . Intimate partner violence:    Fear of current or ex partner: Not on file    Emotionally abused: Not on file    Physically abused: Not on file    Forced sexual activity: Not on file  Other Topics Concern  . Not on file  Social History Narrative   Lives alone at home.  Not working at this time.   No children.  Caffeine 1 cup daily.  Education: 2 yr college.      PHYSICAL EXAM  There were no vitals filed for this visit. There is no height or weight on file to calculate BMI.  Generalized: Well developed, Obese female in no acute distress  Head: normocephalic and atraumatic,. Oropharynx benign  Neck: Supple, no carotid bruits  Cardiac: Regular rate  rhythm, no murmur  Musculoskeletal: No deformity   Neurological examination   Mentation: Alert oriented to time, place, history taking. Attention span and concentration appropriate. Recent and remote memory intact.  Follows all commands speech and language fluent.   Cranial nerve II-XII: Pupils were equal round reactive to light extraocular movements were full, visual field were full on confrontational test. Right nasolabial fold flattening Facial sensation slightly decreased on the right  hearing was intact to finger rubbing bilaterally. Uvula tongue midline. head turning and shoulder shrug were normal and symmetric.Tongue protrusion into cheek strength was normal. Motor: normal bulk and tone, full strength in the BUE, BLE, fine finger movements normal, no pronator drift. No focal weakness Sensory: normal and symmetric to light touch, pinprick, and  Vibration, in  the upper and lower extremities Coordination: finger-nose-finger, heel-to-shin bilaterally, no dysmetria, no tremor Reflexes: 1+ upper lower and symmetric, plantar responses were flexor bilaterally. Gait and Station: Rising up from seated position without assistance, normal stance,  moderate stride, good arm swing, smooth turning, able to perform tiptoe, and heel walking without difficulty. Tandem gait is mildly unsteady. No assistive device  DIAGNOSTIC DATA (LABS, IMAGING, TESTING) - I reviewed patient records, labs, notes, testing and imaging myself where available.  Lab Results  Component Value Date   WBC 7.2 10/26/2016   HGB 15.6 (H) 10/26/2016   HCT 46.0 10/26/2016   MCV 76.9 (L) 10/26/2016   PLT 324 10/26/2016      Component Value Date/Time   NA 138 10/26/2016 2309   K 4.2 10/26/2016 2309   CL 100 (L) 10/26/2016 2309   CO2 22 10/26/2016 2225   GLUCOSE 90 10/26/2016 2309   BUN 10 10/26/2016 2309   CREATININE 0.90 10/26/2016 2309   CALCIUM 9.1 10/26/2016 2225   PROT 7.3 10/26/2016 2225   ALBUMIN 4.2 10/26/2016 2225     AST 19 10/26/2016 2225   ALT 19 10/26/2016 2225   ALKPHOS 63 10/26/2016 2225   BILITOT 0.4 10/26/2016 2225   GFRNONAA >60 10/26/2016 2225   GFRAA >60 10/26/2016 2225   Lab Results  Component Value Date   CHOL 187 10/28/2016   HDL 45 10/28/2016   LDLCALC 117 (H) 10/28/2016   TRIG 125 10/28/2016   CHOLHDL 4.2 10/28/2016   Lab Results  Component Value Date   HGBA1C 6.3 (H) 10/28/2016   No results found for: [id number] Lab Results  Component Value Date   TSH 1.652 10/27/2016      ASSESSMENT AND PLAN  47 y.o. year old female  here for hospital follow-up  after admission for acute left thalamus lacunar infarct secondary to small vessel disease. CTA head and neck unremarkable. Carotid Doppler 1-39% stenosis. 2-D echo vigorous LV systolic function with mild LVH  mild diastolic dysfunction. LDL 117. Hemoglobin A1c 6.3 no anticoagulant prior to admission. Patient has stopped smoking   PLAN: Stressed the importance of management of risk factors to prevent further stroke Continue  Aspirin for secondary stroke prevention Maintain strict control of hypertension with blood pressure goal below 130/90, today's reading 152/94 continue antihypertensive medications Control of diabetes with hemoglobin A1c below 6.5 followed by primary care most recent hemoglobin A1c6.3 Cholesterol with LDL cholesterol less than 70, followed by primary care,  most recent .117 continue statin drugs Lipitor Exercise by walking, 30 min daily  eat healthy diet with whole grains,  fresh fruits and vegetables Suggest Mediterranean Given written instructions along with review of the diet  follow-up in 6 months Discussed risk for recurrent stroke/ TIA and answered additional questions This was a visit requiring 30 minutes and medical decision making of high complexity with extensive review of history, hospital chart, counseling and answering questions for patient and mother Nilda Riggs, [facility], [facility],  APRN  [facility] [address], [address] [place], [place] [address] [phone]

## 2017-06-16 ENCOUNTER — Encounter: Payer: Self-pay | Admitting: Nurse Practitioner

## 2017-06-16 ENCOUNTER — Ambulatory Visit: Payer: MEDICAID | Admitting: Nurse Practitioner

## 2018-05-11 IMAGING — CT CT ANGIO HEAD
2 of 8 series · 8 of 36 positions shown · IV contrast (isovue)
Comparison: Brain MRI 3134 hours today.  Head CT 10/26/2016.

CLINICAL DATA: 45-year-old female with acute lacunar infarct in the
lateral left thalamus on brain MRI today performed for right side
numbness and weakness.

EXAM:
CT ANGIOGRAPHY HEAD AND NECK
TECHNIQUE: Multidetector CT imaging of the head and neck was performed using
the standard protocol during bolus administration of intravenous
contrast. Multiplanar CT image reconstructions and MIPs were
obtained to evaluate the vascular anatomy. Carotid stenosis
measurements (when applicable) are obtained utilizing NASCET
criteria, using the distal internal carotid diameter as the
denominator.
CONTRAST:  50 mL Isovue 370

[Series 7: ax thins · axial · 0.41mm/px · z∈[-324,-78]mm · 6 of 347 slices shown]
[im 50/347  soft-tissue]
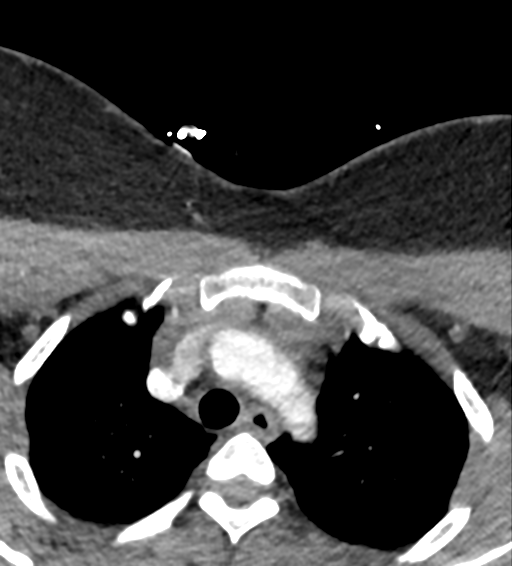
[im 99/347  bone]
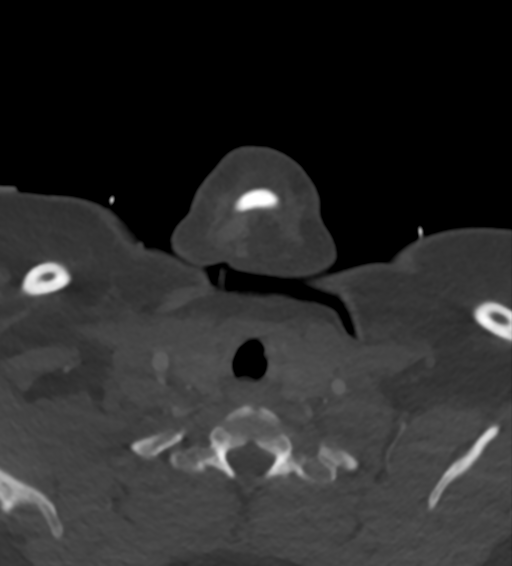
[im 149/347  soft-tissue]
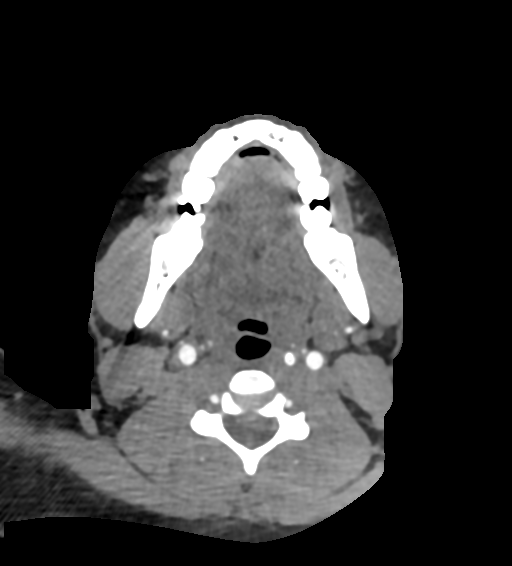
[im 198/347  bone]
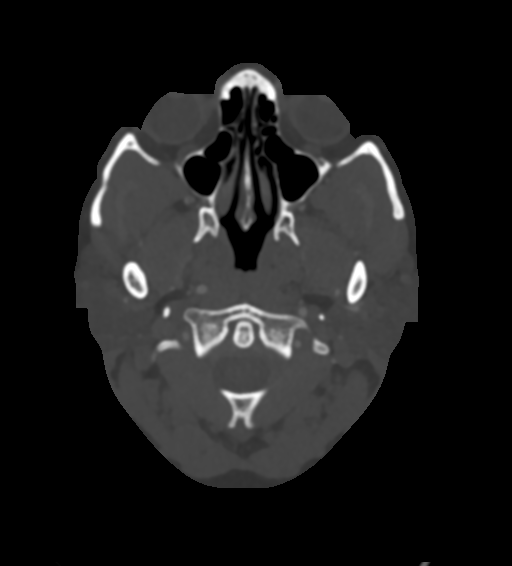
[im 248/347  soft-tissue]
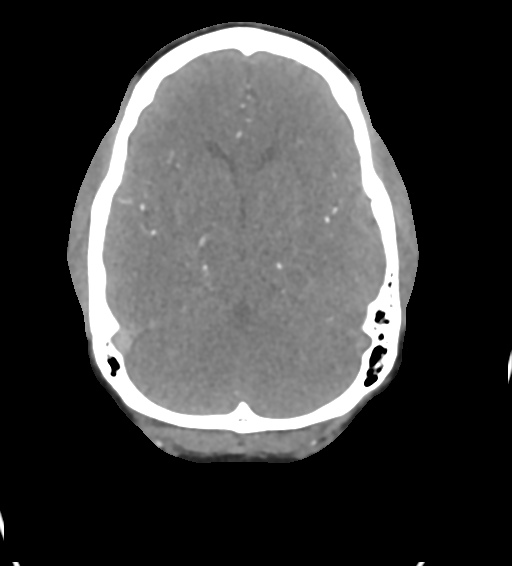
[im 297/347  bone]
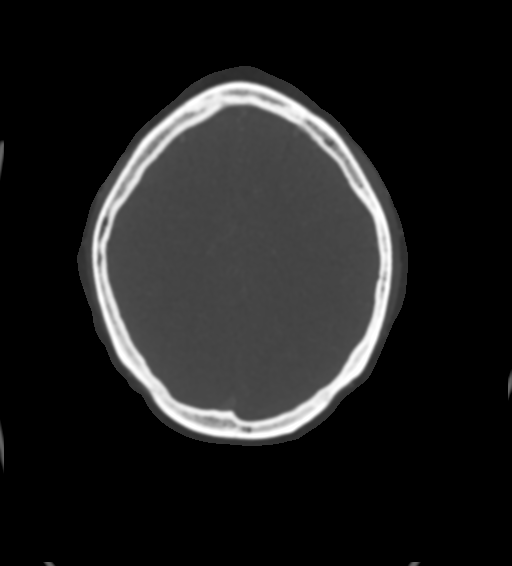

[Series 9: sag thins · sagittal · 0.60mm/px · 2 of 201 slices shown]
[im 12/201  soft-tissue]
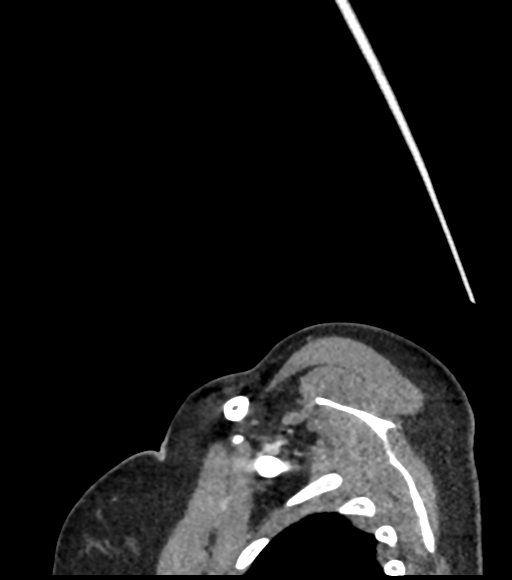
[im 190/201  soft-tissue]
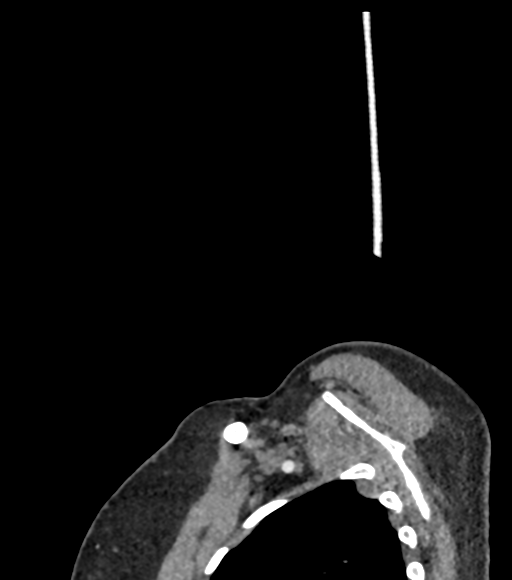

[8 of 36 positions shown; findings below may reference images not displayed]

FINDINGS: CTA NECK

Skeleton: Negative.

Upper chest: Negative visualized mediastinum.  Negative lung apices.

Other neck: Negative.  No cervical lymphadenopathy.

Aortic arch: 3 vessel arch configuration. No arch atherosclerosis or
great vessel origin stenosis.

Right carotid system: Negative right CCA. Mild soft and calcified
plaque at the posterior right ICA origin and bulb with no stenosis.

Left carotid system: Negative.

Vertebral arteries:

No proximal right subclavian artery plaque or stenosis. Normal right
vertebral artery origin. No right vertebral artery stenosis to the
skullbase.

No proximal left subclavian artery plaque or stenosis. Normal left
vertebral artery origin. No left vertebral artery stenosis to the
skullbase.

CTA HEAD

Posterior circulation: Codominant distal vertebral arteries without
stenosis. Normal left PICA origin. The right AICA appears dominant.
No basilar artery stenosis. SCA and PCA origins are patent but
somewhat diminutive. Both posterior communicating arteries are
present. Bilateral PCA branches are within normal limits.

Anterior circulation: There is minimal bilateral ICA siphon
calcified plaque without stenosis. The ophthalmic and posterior
communicating artery origins are normal. Patent carotid termini.
Normal MCA and ACA origins. Anterior communicating artery and
bilateral ACA branches are within normal limits. Left MCA M1
segment, bifurcation, and left MCA branches are within normal
limits. Right MCA M1 segment, bifurcation, and right MCA branches
are within normal limits.

Venous sinuses: Patent on delayed images.

Anatomic variants: Somewhat diminutive vertebrobasilar system, at
least in part related to bilateral posterior communicating arteries
contributing to the PCAs.

Delayed phase: The left thalamic lacunar infarct remains occult by
CT. Gray-white matter differentiation remains normal. No
intracranial hemorrhage or mass effect. No abnormal enhancement
identified.

Review of the MIP images confirms the above findings
IMPRESSION: 1. No emergent large vessel occlusion.
2. No posterior circulation atherosclerosis or stenosis identified.
Somewhat diminutive vertebrobasilar system appears to be related to
bilateral posterior communicating arteries contributing to the PCAs.
3. Mild right carotid bifurcation and minimal ICA siphon plaque
without stenosis.
4. CT appearance of the brain remains normal.
5. No acute findings in the neck.

## 2018-05-11 IMAGING — MR MR HEAD W/O CM
9 of 10 series · 36 of 48 positions shown · non-contrast
Comparison: CT HEAD October 26, 2016

CLINICAL DATA: RIGHT-sided numbness and weakness. History of
hypertension and obesity.

EXAM:
MRI HEAD WITHOUT CONTRAST
TECHNIQUE: Multiplanar, multiecho pulse sequences of the brain and surrounding
structures were obtained without intravenous contrast.

[Series 2: FLAIR · sagittal · 5.0mm · 0.47mm/px · 3 of 23 slices shown (1 of 2)]
[im 1/23]
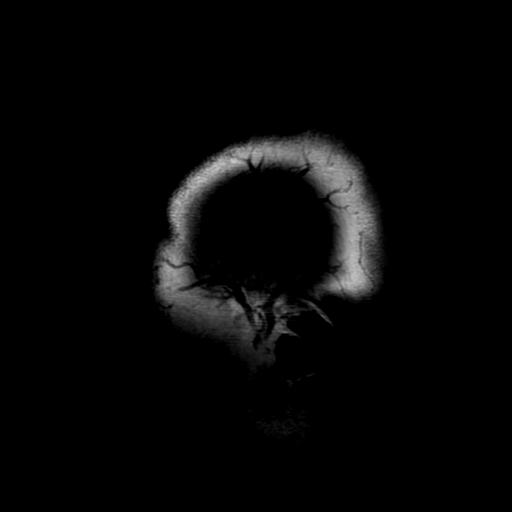
[im 12/23]
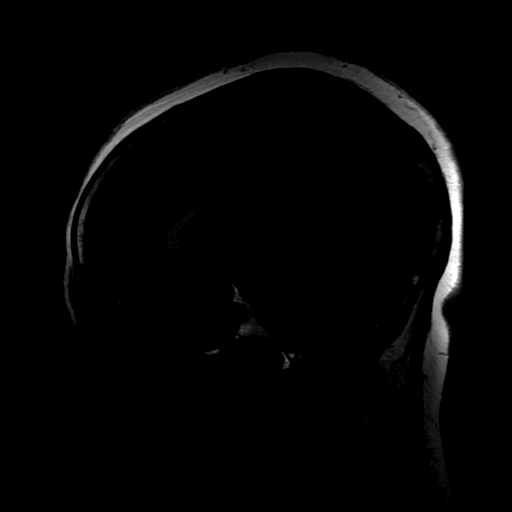
[im 23/23]
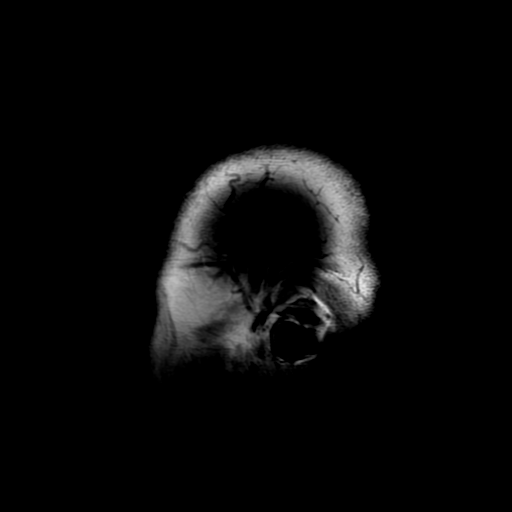

[Series 4: DWI · axial · 3.0mm · 0.94mm/px · z∈[-75,+31]mm · 9 of 90 slices shown (1 of 2)]
[im 1/90]
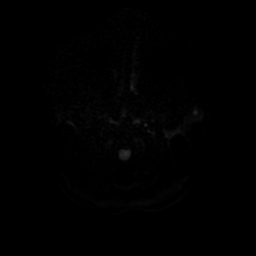
[im 12/90]
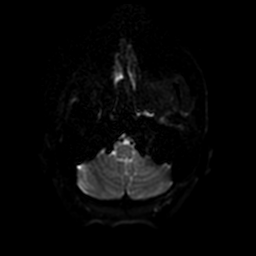
[im 23/90]
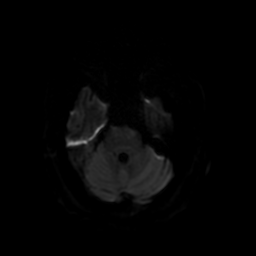
[im 34/90]
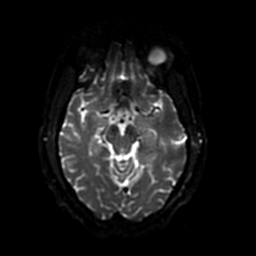
[im 45/90]
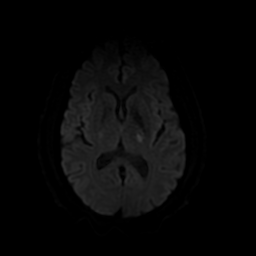
[im 56/90]
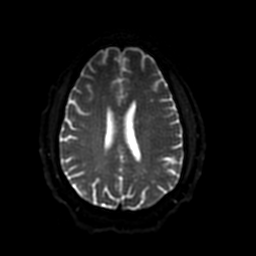
[im 67/90]
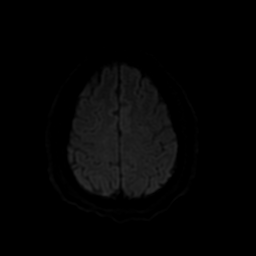
[im 78/90]
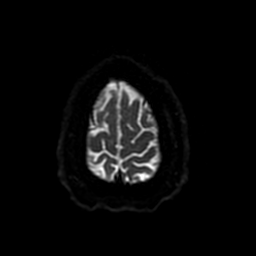
[im 90/90]
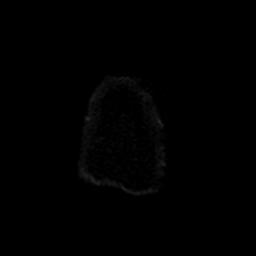

[Series 5: DWI · oblique · 4.0mm · 0.94mm/px · 7 of 70 slices shown (2 of 2)]
[im 1/70]
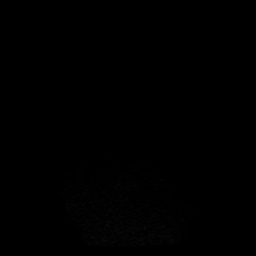
[im 12/70]
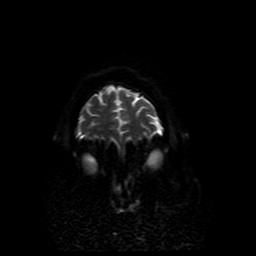
[im 24/70]
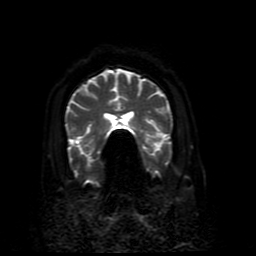
[im 35/70]
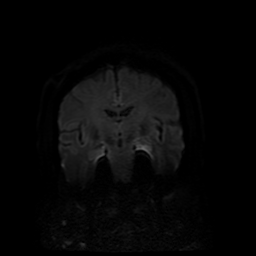
[im 47/70]
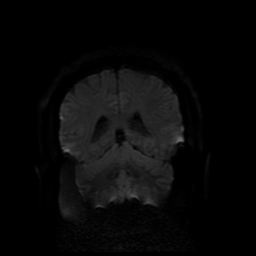
[im 58/70]
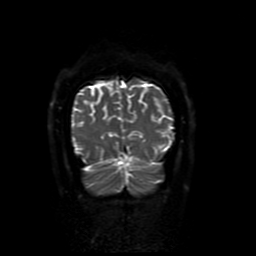
[im 70/70]
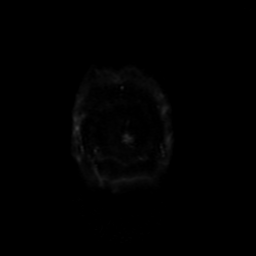

[Series 6: T2 · axial · 5.0mm · 0.47mm/px · z∈[-75,+31]mm · 2 of 23 slices shown (1 of 2)]
[im 1/23]
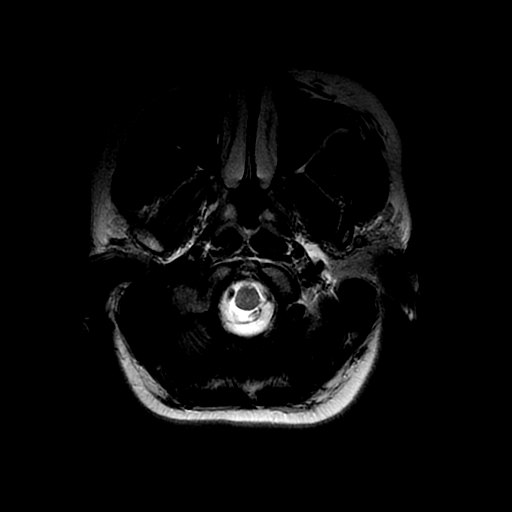
[im 23/23]
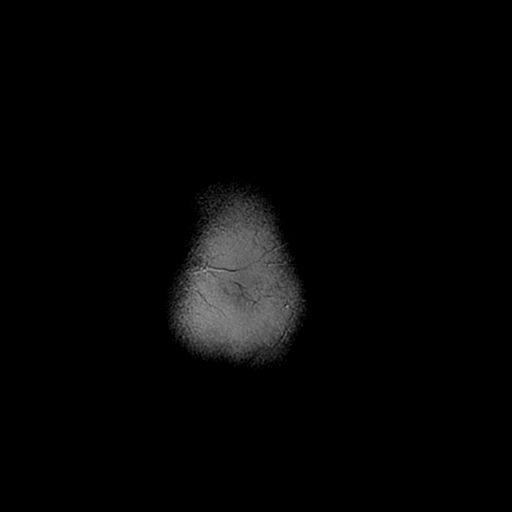

[Series 7: FLAIR · axial · 5.0mm · 0.47mm/px · z∈[-75,+31]mm · 2 of 23 slices shown (2 of 2)]
[im 1/23]
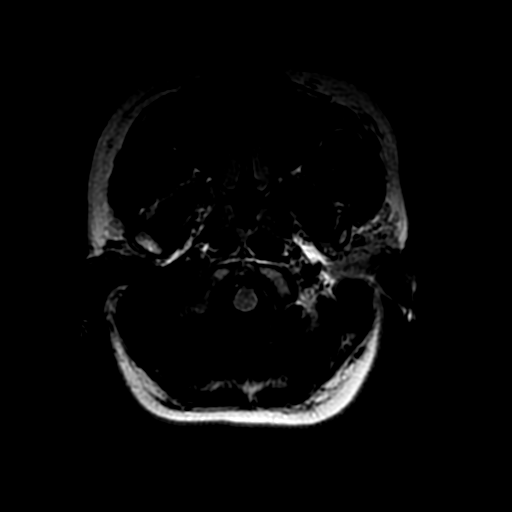
[im 23/23]
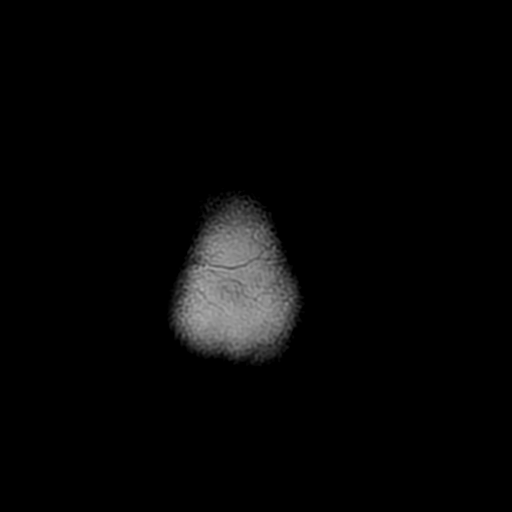

[Series 8: (person_name) · axial · 3.0mm · 0.47mm/px · z∈[-76,-63]mm · 2 of 92 slices shown]
[im 1/92]
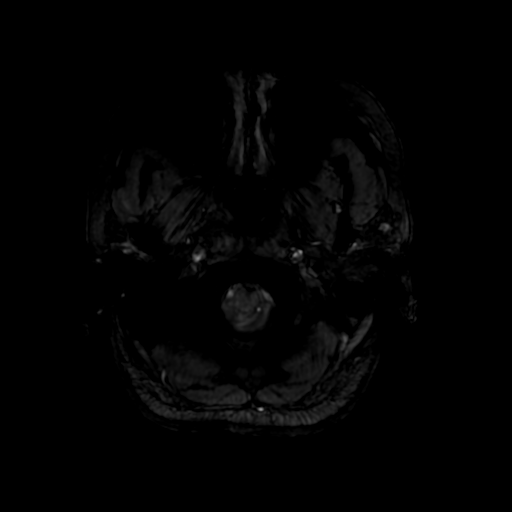
[im 12/92]
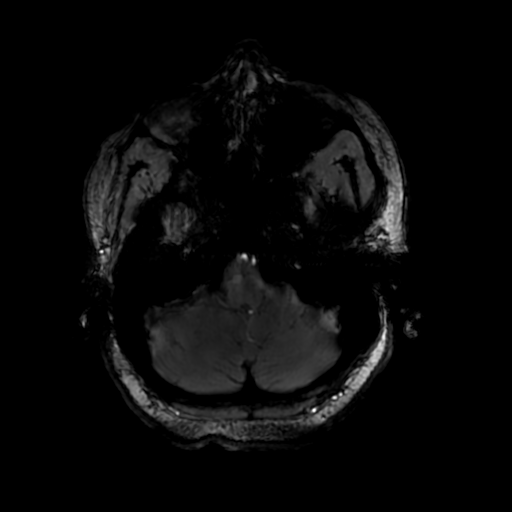

[Series 10: T2 · oblique · 5.0mm · 0.47mm/px · 3 of 29 slices shown (2 of 2)]
[im 1/29]
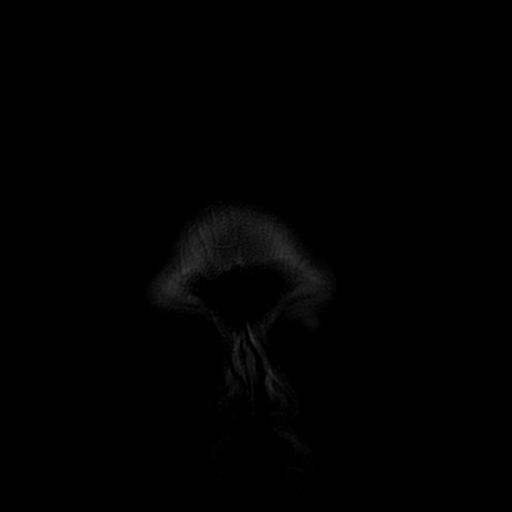
[im 15/29]
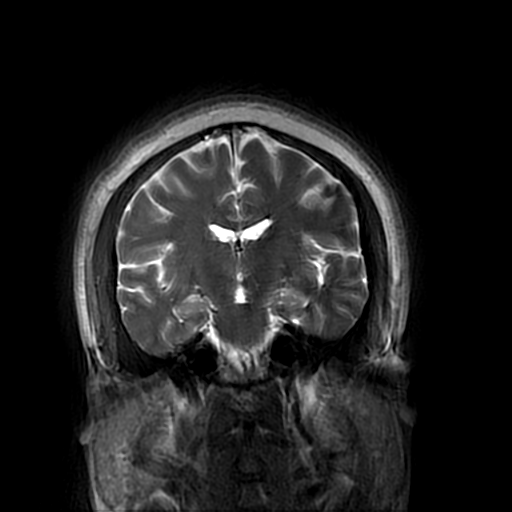
[im 29/29]
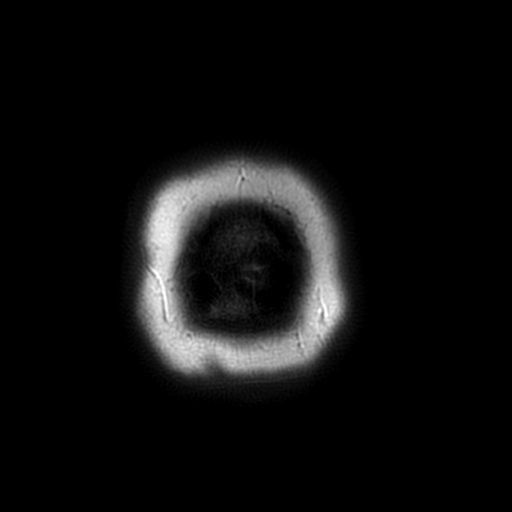

[Series 450: ADC · axial · 3.0mm · 0.94mm/px · z∈[-75,+31]mm · 4 of 43 slices shown (1 of 2)]
[im 1/43]
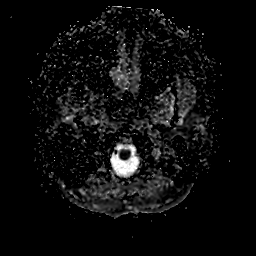
[im 15/43]
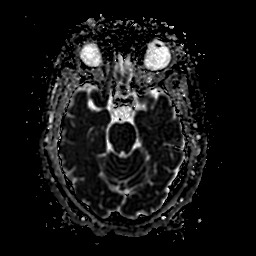
[im 29/43]
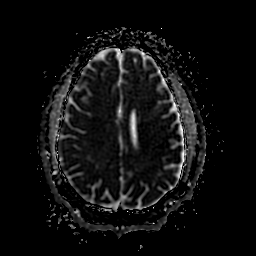
[im 43/43]
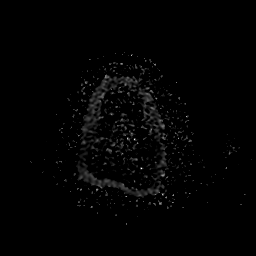

[Series 550: ADC · oblique · 4.0mm · 0.94mm/px · 4 of 35 slices shown (2 of 2)]
[im 1/35]
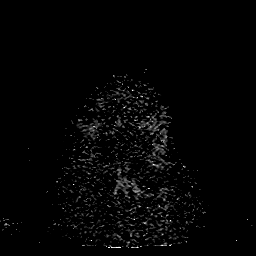
[im 12/35]
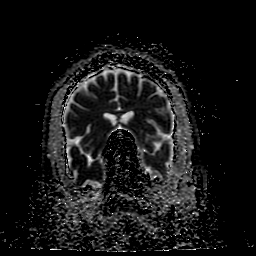
[im 23/35]
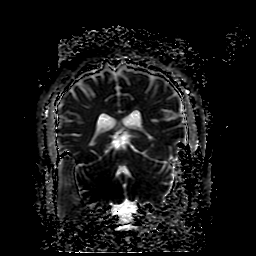
[im 35/35]
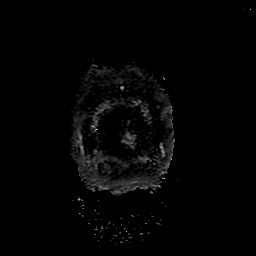

[36 of 48 positions shown; findings below may reference images not displayed]

FINDINGS: BRAIN: Subcentimeter reduced diffusion LEFT thalamus with low ADC
values and faint FLAIR T2 hyperintense signal. No susceptibility
artifact to suggest hemorrhage. The ventricles and sulci are normal
for patient's age. Scattered subcentimeter supratentorial white
matter FLAIR T2 hyperintensities. No suspicious parenchymal signal,
mass or mass effect. No abnormal extra-axial fluid collections.

VASCULAR: Normal major intracranial vascular flow voids present at
skull base.

SKULL AND UPPER CERVICAL SPINE: No abnormal sellar expansion. No
suspicious calvarial bone marrow signal. Craniocervical junction
maintained.

SINUSES/ORBITS: The mastoid air-cells and included paranasal sinuses
are well-aerated. The included ocular globes and orbital contents
are non-suspicious.

OTHER: None.
IMPRESSION: 1. Acute LEFT thalamus lacunar infarct.
2. Mild white matter changes compatible with chronic small vessel
ischemic disease, atypical distribution for demyelination.
